# Patient Record
Sex: Female | Born: 1990 | Race: Black or African American | Hispanic: No | Marital: Single | Smoking: Never smoker
Health system: Southern US, Community
[De-identification: ages and names within clinical notes are randomized; demographics above are authoritative.]

## PROBLEM LIST (undated history)

## (undated) DIAGNOSIS — L309 Dermatitis, unspecified: Secondary | ICD-10-CM

## (undated) DIAGNOSIS — J45909 Unspecified asthma, uncomplicated: Secondary | ICD-10-CM

## (undated) DIAGNOSIS — D649 Anemia, unspecified: Secondary | ICD-10-CM

## (undated) HISTORY — PX: DENTAL SURGERY: SHX609

## (undated) HISTORY — DX: Anemia, unspecified: D64.9

---

## 2015-07-15 ENCOUNTER — Emergency Department (HOSPITAL_COMMUNITY)
Admission: EM | Admit: 2015-07-15 | Discharge: 2015-07-15 | Disposition: A | Discharge status: Home / Self Care | Payer: No Typology Code available for payment source | Attending: Emergency Medicine | Admitting: Emergency Medicine

## 2015-07-15 ENCOUNTER — Encounter (HOSPITAL_COMMUNITY): Payer: Self-pay | Admitting: *Deleted

## 2015-07-15 DIAGNOSIS — Y9389 Activity, other specified: Secondary | ICD-10-CM | POA: Diagnosis not present

## 2015-07-15 DIAGNOSIS — R51 Headache: Secondary | ICD-10-CM

## 2015-07-15 DIAGNOSIS — R519 Headache, unspecified: Secondary | ICD-10-CM

## 2015-07-15 DIAGNOSIS — Y998 Other external cause status: Secondary | ICD-10-CM | POA: Insufficient documentation

## 2015-07-15 DIAGNOSIS — Y92410 Unspecified street and highway as the place of occurrence of the external cause: Secondary | ICD-10-CM | POA: Diagnosis not present

## 2015-07-15 DIAGNOSIS — S0990XA Unspecified injury of head, initial encounter: Secondary | ICD-10-CM | POA: Insufficient documentation

## 2015-07-15 DIAGNOSIS — J45909 Unspecified asthma, uncomplicated: Secondary | ICD-10-CM | POA: Insufficient documentation

## 2015-07-15 HISTORY — DX: Unspecified asthma, uncomplicated: J45.909

## 2015-07-15 MED ORDER — ACETAMINOPHEN 325 MG PO TABS
650.0000 mg | ORAL_TABLET | Freq: Once | ORAL | Status: AC
  Administered 2015-07-15: 650 mg via ORAL
  Filled 2015-07-15: qty 2
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
## 2015-07-15 NOTE — ED Notes (Signed)
Declined W/C at D/C and was escorted to lobby by RN. 
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
## 2016-01-03 ENCOUNTER — Other Ambulatory Visit: Payer: Self-pay | Admitting: Family Medicine

## 2016-01-03 ENCOUNTER — Other Ambulatory Visit (HOSPITAL_COMMUNITY)
Admission: RE | Admit: 2016-01-03 | Discharge: 2016-01-03 | Disposition: A | Discharge status: Home / Self Care | Payer: 59 | Source: Ambulatory Visit | Attending: Family Medicine | Admitting: Family Medicine

## 2016-01-03 DIAGNOSIS — Z01419 Encounter for gynecological examination (general) (routine) without abnormal findings: Secondary | ICD-10-CM | POA: Insufficient documentation

## 2016-01-07 LAB — CYTOLOGY - PAP: Diagnosis: NEGATIVE

## 2016-08-01 DIAGNOSIS — N920 Excessive and frequent menstruation with regular cycle: Secondary | ICD-10-CM | POA: Diagnosis not present

## 2016-08-01 DIAGNOSIS — N898 Other specified noninflammatory disorders of vagina: Secondary | ICD-10-CM | POA: Diagnosis not present

## 2016-08-01 DIAGNOSIS — D509 Iron deficiency anemia, unspecified: Secondary | ICD-10-CM | POA: Diagnosis not present

## 2016-08-01 DIAGNOSIS — J4530 Mild persistent asthma, uncomplicated: Secondary | ICD-10-CM | POA: Diagnosis not present

## 2016-08-14 DIAGNOSIS — S91312A Laceration without foreign body, left foot, initial encounter: Secondary | ICD-10-CM | POA: Diagnosis not present

## 2017-05-14 DIAGNOSIS — Z01419 Encounter for gynecological examination (general) (routine) without abnormal findings: Secondary | ICD-10-CM | POA: Diagnosis not present

## 2017-05-14 DIAGNOSIS — N760 Acute vaginitis: Secondary | ICD-10-CM | POA: Diagnosis not present

## 2017-08-06 DIAGNOSIS — L739 Follicular disorder, unspecified: Secondary | ICD-10-CM | POA: Diagnosis not present

## 2017-10-15 DIAGNOSIS — N898 Other specified noninflammatory disorders of vagina: Secondary | ICD-10-CM | POA: Diagnosis not present

## 2017-10-15 DIAGNOSIS — H6120 Impacted cerumen, unspecified ear: Secondary | ICD-10-CM | POA: Diagnosis not present

## 2017-10-26 DIAGNOSIS — R42 Dizziness and giddiness: Secondary | ICD-10-CM | POA: Diagnosis not present

## 2017-12-11 DIAGNOSIS — H6120 Impacted cerumen, unspecified ear: Secondary | ICD-10-CM | POA: Diagnosis not present

## 2017-12-11 DIAGNOSIS — B353 Tinea pedis: Secondary | ICD-10-CM | POA: Diagnosis not present

## 2017-12-11 DIAGNOSIS — D509 Iron deficiency anemia, unspecified: Secondary | ICD-10-CM | POA: Diagnosis not present

## 2017-12-11 DIAGNOSIS — N946 Dysmenorrhea, unspecified: Secondary | ICD-10-CM | POA: Diagnosis not present

## 2017-12-18 ENCOUNTER — Encounter (HOSPITAL_COMMUNITY): Payer: 59

## 2017-12-24 ENCOUNTER — Other Ambulatory Visit (HOSPITAL_COMMUNITY): Payer: Self-pay | Admitting: *Deleted

## 2017-12-25 ENCOUNTER — Encounter (HOSPITAL_COMMUNITY): Payer: Self-pay

## 2017-12-25 DIAGNOSIS — D500 Iron deficiency anemia secondary to blood loss (chronic): Secondary | ICD-10-CM | POA: Diagnosis not present

## 2017-12-25 DIAGNOSIS — N920 Excessive and frequent menstruation with regular cycle: Secondary | ICD-10-CM | POA: Diagnosis not present

## 2017-12-25 DIAGNOSIS — N898 Other specified noninflammatory disorders of vagina: Secondary | ICD-10-CM | POA: Diagnosis not present

## 2017-12-25 DIAGNOSIS — N946 Dysmenorrhea, unspecified: Secondary | ICD-10-CM | POA: Diagnosis not present

## 2018-02-16 DIAGNOSIS — N760 Acute vaginitis: Secondary | ICD-10-CM | POA: Diagnosis not present

## 2018-03-09 DIAGNOSIS — H6120 Impacted cerumen, unspecified ear: Secondary | ICD-10-CM | POA: Diagnosis not present

## 2018-03-09 DIAGNOSIS — H9201 Otalgia, right ear: Secondary | ICD-10-CM | POA: Diagnosis not present

## 2019-05-02 DIAGNOSIS — Z3202 Encounter for pregnancy test, result negative: Secondary | ICD-10-CM | POA: Diagnosis not present

## 2019-05-02 DIAGNOSIS — Z3042 Encounter for surveillance of injectable contraceptive: Secondary | ICD-10-CM | POA: Diagnosis not present

## 2019-05-10 DIAGNOSIS — Z0000 Encounter for general adult medical examination without abnormal findings: Secondary | ICD-10-CM | POA: Diagnosis not present

## 2019-05-10 DIAGNOSIS — D649 Anemia, unspecified: Secondary | ICD-10-CM | POA: Diagnosis not present

## 2019-07-18 DIAGNOSIS — Z3042 Encounter for surveillance of injectable contraceptive: Secondary | ICD-10-CM | POA: Diagnosis not present

## 2019-08-22 DIAGNOSIS — N765 Ulceration of vagina: Secondary | ICD-10-CM | POA: Diagnosis not present

## 2019-08-22 DIAGNOSIS — N760 Acute vaginitis: Secondary | ICD-10-CM | POA: Diagnosis not present

## 2019-08-25 DIAGNOSIS — N898 Other specified noninflammatory disorders of vagina: Secondary | ICD-10-CM | POA: Diagnosis not present

## 2019-08-29 DIAGNOSIS — Z111 Encounter for screening for respiratory tuberculosis: Secondary | ICD-10-CM | POA: Diagnosis not present

## 2019-10-03 DIAGNOSIS — Z03818 Encounter for observation for suspected exposure to other biological agents ruled out: Secondary | ICD-10-CM | POA: Diagnosis not present

## 2019-10-03 DIAGNOSIS — Z20822 Contact with and (suspected) exposure to covid-19: Secondary | ICD-10-CM | POA: Diagnosis not present

## 2019-10-03 DIAGNOSIS — Z3042 Encounter for surveillance of injectable contraceptive: Secondary | ICD-10-CM | POA: Diagnosis not present

## 2019-10-04 DIAGNOSIS — Z20822 Contact with and (suspected) exposure to covid-19: Secondary | ICD-10-CM | POA: Diagnosis not present

## 2019-10-04 DIAGNOSIS — Z03818 Encounter for observation for suspected exposure to other biological agents ruled out: Secondary | ICD-10-CM | POA: Diagnosis not present

## 2019-10-13 DIAGNOSIS — Z20822 Contact with and (suspected) exposure to covid-19: Secondary | ICD-10-CM | POA: Diagnosis not present

## 2019-11-06 DIAGNOSIS — H6121 Impacted cerumen, right ear: Secondary | ICD-10-CM | POA: Diagnosis not present

## 2019-12-19 DIAGNOSIS — Z3042 Encounter for surveillance of injectable contraceptive: Secondary | ICD-10-CM | POA: Diagnosis not present

## 2020-01-10 ENCOUNTER — Other Ambulatory Visit (HOSPITAL_COMMUNITY): Payer: Self-pay | Admitting: Family Medicine

## 2020-02-23 ENCOUNTER — Other Ambulatory Visit (HOSPITAL_COMMUNITY): Payer: Self-pay | Admitting: Family Medicine

## 2020-07-08 ENCOUNTER — Encounter: Payer: Self-pay | Admitting: Internal Medicine

## 2020-08-01 ENCOUNTER — Encounter: Payer: Self-pay | Admitting: Nurse Practitioner

## 2020-08-01 ENCOUNTER — Other Ambulatory Visit (HOSPITAL_COMMUNITY)
Admission: RE | Admit: 2020-08-01 | Discharge: 2020-08-01 | Disposition: A | Discharge status: Home / Self Care | Payer: BC Managed Care – PPO | Source: Ambulatory Visit | Attending: Nurse Practitioner | Admitting: Nurse Practitioner

## 2020-08-01 ENCOUNTER — Other Ambulatory Visit: Payer: Self-pay

## 2020-08-01 ENCOUNTER — Ambulatory Visit (INDEPENDENT_AMBULATORY_CARE_PROVIDER_SITE_OTHER): Payer: BC Managed Care – PPO | Admitting: Nurse Practitioner

## 2020-08-01 VITALS — BP 104/68 | HR 98 | Resp 16 | Ht 65.25 in | Wt 141.0 lb

## 2020-08-01 DIAGNOSIS — Z01419 Encounter for gynecological examination (general) (routine) without abnormal findings: Secondary | ICD-10-CM | POA: Diagnosis not present

## 2020-08-01 DIAGNOSIS — D649 Anemia, unspecified: Secondary | ICD-10-CM

## 2020-08-01 DIAGNOSIS — Z113 Encounter for screening for infections with a predominantly sexual mode of transmission: Secondary | ICD-10-CM | POA: Diagnosis not present

## 2020-08-01 LAB — CBC WITH DIFFERENTIAL/PLATELET
Basophils Absolute: 52 cells/uL (ref 0–200)
Eosinophils Relative: 6.7 %
Neutrophils Relative %: 52.5 %
RBC: 3.71 10*6/uL — ABNORMAL LOW (ref 3.80–5.10)
WBC: 5.8 10*3/uL (ref 3.8–10.8)

## 2020-08-01 NOTE — Progress Notes (Signed)
   Amanda Ortega 1990-09-03 [id number]   History:  30 y.o. G0 presents as new patient to establish care. Was on Depo Provera for menorrhagia but it caused mood changes and significant hunger so she stopped with last dose in September 2021. She has had 1 cycle since then. Normal pap history. History of anemia secondary to heavy menses, taking OTC iron supplement. Sexually active with one partner. Is not interested in contraception at this time.   Gynecologic History Patient's last menstrual period was 07/04/2020 (exact date).   Contraception/Family planning: none  Health Maintenance Last Pap: 12/2015. Results were: normal Last mammogram: Not indicated Last colonoscopy: Not indicated Last Dexa: Not indicated  Past medical history, past surgical history, family history and social history were all reviewed and documented in the EPIC chart. 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   ROS:  A ROS was performed and pertinent positives and negatives are included.  Exam:  Vitals:   08/01/20 0857  BP: 104/68  Pulse: 98  Resp: 16  Weight: 141 lb (64 kg)  Height: 5' 5.25" (1.657 m)   Body mass index is 23.28 kg/m.  General appearance:  Normal Thyroid:  Symmetrical, normal in size, without palpable masses or nodularity. Respiratory  Auscultation:  Clear without wheezing or rhonchi Cardiovascular  Auscultation:  Regular rate, without rubs, murmurs or gallops  Edema/varicosities:  Not grossly evident Abdominal  Soft,nontender, without masses, guarding or rebound.  Liver/spleen:  No organomegaly noted  Hernia:  None appreciated  Skin  Inspection:  Grossly normal Breasts: Examined lying and sitting.   Right: Without masses, retractions, nipple discharge or axillary adenopathy.   Left: Without masses, retractions, nipple discharge or axillary adenopathy. Genitourinary   Inguinal/mons:  Normal without inguinal adenopathy  External genitalia:  Normal appearing vulva with no  masses, tenderness, or lesions  BUS/Urethra/Skene's glands:  Normal  Vagina:  Normal appearing with normal color and discharge, no lesions  Cervix:  Normal appearing without discharge or lesions  Uterus:  Normal in size, shape and contour.  Midline and mobile, nontender  Adnexa/parametria:     Rt: Normal in size, without masses or tenderness.   Lt: Normal in size, without masses or tenderness.  Anus and perineum: Normal  Assessment/Plan:  30 y.o. G0 for annual exam.   Well female exam with routine gynecological exam - Plan: Cytology - PAP( [facility]). Education provided on SBEs, importance of preventative screenings, current guidelines, high calcium diet, regular exercise, and multivitamin daily.   Anemia, unspecified type - Plan: CBC with Differential/Platelet. History of anemia secondary to heavy menses. Was on Depo Provera for about 2 years with good control of bleeding but she decided to stop (last dose 11/2019) due to mood changes and intense hunger. She has had 1 cycle since. She is not interested in any form of hormonal contraception at this time. If cycles become heavy she will reconsider.   Screen for STD (sexually transmitted disease) - Plan: RPR, HIV Antibody (routine testing w rflx), Cytology - PAP( [facility])  Screening for cervical cancer - normal pap history. Pap today.  Return in 1 year for annual.    Olivia Mackie DNP, 9:29 AM 08/01/2020

## 2020-08-01 NOTE — Patient Instructions (Signed)
Health Maintenance, Female Adopting a healthy lifestyle and getting preventive care are important in promoting health and wellness. Ask your health care provider about:  The right schedule for you to have regular tests and exams.  Things you can do on your own to prevent diseases and keep yourself healthy. What should I know about diet, weight, and exercise? Eat a healthy diet  Eat a diet that includes plenty of vegetables, fruits, low-fat dairy products, and lean protein.  Do not eat a lot of foods that are high in solid fats, added sugars, or sodium.   Maintain a healthy weight Body mass index (BMI) is used to identify weight problems. It estimates body fat based on height and weight. Your health care provider can help determine your BMI and help you achieve or maintain a healthy weight. Get regular exercise Get regular exercise. This is one of the most important things you can do for your health. Most adults should:  Exercise for at least 150 minutes each week. The exercise should increase your heart rate and make you sweat (moderate-intensity exercise).  Do strengthening exercises at least twice a week. This is in addition to the moderate-intensity exercise.  Spend less time sitting. Even light physical activity can be beneficial. Watch cholesterol and blood lipids Have your blood tested for lipids and cholesterol at 28 years of age, then have this test every 5 years. Have your cholesterol levels checked more often if:  Your lipid or cholesterol levels are high.  You are older than 28 years of age.  You are at high risk for heart disease. What should I know about cancer screening? Depending on your health history and family history, you may need to have cancer screening at various ages. This may include screening for:  Breast cancer.  Cervical cancer.  Colorectal cancer.  Skin cancer.  Lung cancer. What should I know about heart disease, diabetes, and high blood  pressure? Blood pressure and heart disease  High blood pressure causes heart disease and increases the risk of stroke. This is more likely to develop in people who have high blood pressure readings, are of African descent, or are overweight.  Have your blood pressure checked: ? Every 3-5 years if you are 18-39 years of age. ? Every year if you are 40 years old or older. Diabetes Have regular diabetes screenings. This checks your fasting blood sugar level. Have the screening done:  Once every three years after age 40 if you are at a normal weight and have a low risk for diabetes.  More often and at a younger age if you are overweight or have a high risk for diabetes. What should I know about preventing infection? Hepatitis B If you have a higher risk for hepatitis B, you should be screened for this virus. Talk with your health care provider to find out if you are at risk for hepatitis B infection. Hepatitis C Testing is recommended for:  Everyone born from 1945 through 1965.  Anyone with known risk factors for hepatitis C. Sexually transmitted infections (STIs)  Get screened for STIs, including gonorrhea and chlamydia, if: ? You are sexually active and are younger than 28 years of age. ? You are older than 28 years of age and your health care provider tells you that you are at risk for this type of infection. ? Your sexual activity has changed since you were last screened, and you are at increased risk for chlamydia or gonorrhea. Ask your health care provider   if you are at risk.  Ask your health care provider about whether you are at high risk for HIV. Your health care provider may recommend a prescription medicine to help prevent HIV infection. If you choose to take medicine to prevent HIV, you should first get tested for HIV. You should then be tested every 3 months for as long as you are taking the medicine. Pregnancy  If you are about to stop having your period (premenopausal) and  you may become pregnant, seek counseling before you get pregnant.  Take 400 to 800 micrograms (mcg) of folic acid every day if you become pregnant.  Ask for birth control (contraception) if you want to prevent pregnancy. Osteoporosis and menopause Osteoporosis is a disease in which the bones lose minerals and strength with aging. This can result in bone fractures. If you are 65 years old or older, or if you are at risk for osteoporosis and fractures, ask your health care provider if you should:  Be screened for bone loss.  Take a calcium or vitamin D supplement to lower your risk of fractures.  Be given hormone replacement therapy (HRT) to treat symptoms of menopause. Follow these instructions at home: Lifestyle  Do not use any products that contain nicotine or tobacco, such as cigarettes, e-cigarettes, and chewing tobacco. If you need help quitting, ask your health care provider.  Do not use street drugs.  Do not share needles.  Ask your health care provider for help if you need support or information about quitting drugs. Alcohol use  Do not drink alcohol if: ? Your health care provider tells you not to drink. ? You are pregnant, may be pregnant, or are planning to become pregnant.  If you drink alcohol: ? Limit how much you use to 0-1 drink a day. ? Limit intake if you are breastfeeding.  Be aware of how much alcohol is in your drink. In the [place] one drink equals one 12 oz bottle of beer (355 mL), one 5 oz glass of wine (148 mL), or one 1 oz glass of hard liquor (44 mL). General instructions  Schedule regular health, dental, and eye exams.  Stay current with your vaccines.  Tell your health care provider if: ? You often feel depressed. ? You have ever been abused or do not feel safe at home. Summary  Adopting a healthy lifestyle and getting preventive care are important in promoting health and wellness.  Follow your health care provider's instructions about healthy  diet, exercising, and getting tested or screened for diseases.  Follow your health care provider's instructions on monitoring your cholesterol and blood pressure. This information is not intended to replace advice given to you by your health care provider. Make sure you discuss any questions you have with your health care provider. Document Revised: 03/03/2018 Document Reviewed: 03/03/2018 Elsevier Patient Education  2021 Elsevier Inc.  

## 2020-08-02 ENCOUNTER — Encounter (HOSPITAL_COMMUNITY): Payer: Self-pay

## 2020-08-02 ENCOUNTER — Ambulatory Visit (INDEPENDENT_AMBULATORY_CARE_PROVIDER_SITE_OTHER): Payer: BC Managed Care – PPO

## 2020-08-02 ENCOUNTER — Ambulatory Visit (HOSPITAL_COMMUNITY)
Admission: EM | Admit: 2020-08-02 | Discharge: 2020-08-02 | Disposition: A | Discharge status: Home / Self Care | Payer: BC Managed Care – PPO | Attending: Physician Assistant | Admitting: Physician Assistant

## 2020-08-02 DIAGNOSIS — W57XXXA Bitten or stung by nonvenomous insect and other nonvenomous arthropods, initial encounter: Secondary | ICD-10-CM

## 2020-08-02 DIAGNOSIS — S60862A Insect bite (nonvenomous) of left wrist, initial encounter: Secondary | ICD-10-CM | POA: Diagnosis not present

## 2020-08-02 DIAGNOSIS — S60459A Superficial foreign body of unspecified finger, initial encounter: Secondary | ICD-10-CM | POA: Diagnosis not present

## 2020-08-02 DIAGNOSIS — S80862A Insect bite (nonvenomous), left lower leg, initial encounter: Secondary | ICD-10-CM

## 2020-08-02 DIAGNOSIS — M79644 Pain in right finger(s): Secondary | ICD-10-CM | POA: Diagnosis not present

## 2020-08-02 LAB — CYTOLOGY - PAP
Chlamydia: NEGATIVE
Comment: NEGATIVE
Comment: NORMAL
Diagnosis: NEGATIVE
Neisseria Gonorrhea: NEGATIVE

## 2020-08-02 LAB — CBC WITH DIFFERENTIAL/PLATELET
Absolute Monocytes: 760 cells/uL (ref 200–950)
Basophils Relative: 0.9 %
Eosinophils Absolute: 389 cells/uL (ref 15–500)
HCT: 36.2 % (ref 35.0–45.0)
Hemoglobin: 11.7 g/dL (ref 11.7–15.5)
Lymphs Abs: 1554 cells/uL (ref 850–3900)
MCH: 31.5 pg (ref 27.0–33.0)
MCHC: 32.3 g/dL (ref 32.0–36.0)
MCV: 97.6 fL (ref 80.0–100.0)
MPV: 10.7 fL (ref 7.5–12.5)
Monocytes Relative: 13.1 %
Neutro Abs: 3045 cells/uL (ref 1500–7800)
Platelets: 211 10*3/uL (ref 140–400)
RDW: 11.7 % (ref 11.0–15.0)
Total Lymphocyte: 26.8 %

## 2020-08-02 LAB — RPR: RPR Ser Ql: NONREACTIVE

## 2020-08-02 LAB — HIV ANTIBODY (ROUTINE TESTING W REFLEX): HIV 1&2 Ab, 4th Generation: NONREACTIVE

## 2020-08-02 NOTE — ED Triage Notes (Signed)
Pt reports pain swelling in the right thumb x 3 weeks. States she has a pice of glass.   Pt reports burning and itchy sensation in the lower legs x 1 week. Pt states it may be related to insect bites.

## 2020-08-02 NOTE — Discharge Instructions (Signed)
Hydrocortisone cream to rash.  Topical antibiotic to finger

## 2020-08-03 NOTE — ED Provider Notes (Signed)
[facility]    CSN: [id number] Arrival date & time: 08/02/20  1930      History   Chief Complaint Chief Complaint  Patient presents with  . Hand Pain  . Foreign Body  . Rash    HPI Amanda Ortega is a 30 y.o. female.   Pt reports she thinks she has a piece of glass from a broken mirror in her thumb.  Pt also has insect bites on her legs.   The history is provided by the patient. No language interpreter was used.  Hand Pain This is a new problem. The problem occurs constantly. Nothing aggravates the symptoms. Nothing relieves the symptoms. She has tried nothing for the symptoms.  Foreign Body Rash   Past Medical History:  Diagnosis Date  . Anemia   . Asthma     There are no problems to display for this patient.   Past Surgical History:  Procedure Laterality Date  . DENTAL SURGERY      OB History    Gravida  0   Para  0   Term  0   Preterm  0   AB  0   Living  0     SAB  0   IAB  0   Ectopic  0   Multiple  0   Live Births  0            Home Medications    Prior to Admission medications   Medication Sig Start Date End Date Taking? Authorizing Provider  albuterol (VENTOLIN HFA) 108 (90 Base) MCG/ACT inhaler SMARTSIG:2 Puff(s) By Mouth Every 6 Hours 04/02/20   [provider]  cetirizine (ZYRTEC) 10 MG tablet Take 10 mg by mouth daily.    [provider]  Ferrous Sulfate (IRON PO) Take by mouth.    [provider]  fluticasone (FLONASE) 50 MCG/ACT nasal spray Place 2 sprays into both nostrils daily. 07/01/20   [provider]  triamcinolone (KENALOG) 0.1 % APPLY EXTERNALLY TWO TIMES A DAY 02/23/20 02/22/21  Blair Heys, MD    Family History Family History  Problem Relation Age of Onset  . Hypertension Mother   . Hypertension Father     Social History Social History   Tobacco Use  . Smoking status: Never Smoker  . Smokeless tobacco: Never Used  Substance Use Topics  . Alcohol use:  Yes    Comment: social  . Drug use: Never     Allergies   Shellfish allergy and Naproxen   Review of Systems Review of Systems  Skin: Positive for rash.  All other systems reviewed and are negative.    Physical Exam Triage Vital Signs ED Triage Vitals  Enc Vitals Group     BP 08/02/20 1954 (!) 104/58     Pulse Rate 08/02/20 1954 79     Resp 08/02/20 1954 15     Temp 08/02/20 1954 98.5 F (36.9 C)     Temp Source 08/02/20 1954 Oral     SpO2 08/02/20 1954 100 %     Weight --      Height --      Head Circumference --      Peak Flow --      Pain Score 08/02/20 1953 3     Pain Loc --      Pain Edu? --      Excl. in GC? --    No data found.  Updated Vital Signs BP (!) 104/58 (BP  Location: Right Arm)   Pulse 79   Temp 98.5 F (36.9 C) (Oral)   Resp 15   LMP 07/04/2020 (Exact Date)   SpO2 100%   Visual Acuity Right Eye Distance:   Left Eye Distance:   Bilateral Distance:    Right Eye Near:   Left Eye Near:    Bilateral Near:     Physical Exam Vitals and nursing note reviewed.  Musculoskeletal:        General: Swelling present.     Comments: Swollen area right thumb,  Scattered bites bilat lower legs   Skin:    General: Skin is warm.  Neurological:     General: No focal deficit present.     Mental Status: She is alert.  Psychiatric:        Mood and Affect: Mood normal.      UC Treatments / Results  Labs (all labs ordered are listed, but only abnormal results are displayed) Labs Reviewed - No data to display  EKG   Radiology DG Finger Thumb Right  Result Date: 08/02/2020 CLINICAL DATA:  Thumb pain for several weeks, initial encounter EXAM: RIGHT THUMB 2+V COMPARISON:  None. FINDINGS: No acute fracture or dislocation is noted. No radiopaque foreign body is seen. No soft tissue abnormality is noted. IMPRESSION: No acute abnormality seen. Electronically Signed   By: Alcide Clever M.D.   On: 08/02/2020 20:36    Procedures Procedures (including  critical care time)  Medications Ordered in UC Medications - No data to display  Initial Impression / Assessment and Plan / UC Course  I have reviewed the triage vital signs and the nursing notes.  Pertinent labs & imaging results that were available during my care of the patient were reviewed by me and considered in my medical decision making (see chart for details).     MDM:  Pt advised hydrocortisone ointment for rash on legs.   Procedure.  I removed scab on thumb,  I did not see obvious foreign body.  I may have removed with scab.  I counseled pt on follow up Final Clinical Impressions(s) / UC Diagnoses   Final diagnoses:  Foreign body of finger  Insect bite of left lower leg, initial encounter     Discharge Instructions     Hydrocortisone cream to rash.  Topical antibiotic to finger    ED Prescriptions    None     PDMP not reviewed this encounter.  An After Visit Summary was printed and given to the patient.    Elson Areas, [place] 08/03/20 1224

## 2020-08-23 ENCOUNTER — Other Ambulatory Visit: Payer: Self-pay

## 2020-08-24 ENCOUNTER — Encounter: Payer: Self-pay | Admitting: Internal Medicine

## 2020-08-24 ENCOUNTER — Ambulatory Visit: Payer: BC Managed Care – PPO | Admitting: Internal Medicine

## 2020-08-24 VITALS — BP 98/64 | HR 84 | Temp 97.7°F | Ht 64.5 in | Wt 141.0 lb

## 2020-08-24 DIAGNOSIS — H6121 Impacted cerumen, right ear: Secondary | ICD-10-CM | POA: Diagnosis not present

## 2020-08-24 DIAGNOSIS — Z0000 Encounter for general adult medical examination without abnormal findings: Secondary | ICD-10-CM | POA: Diagnosis not present

## 2020-08-24 DIAGNOSIS — L309 Dermatitis, unspecified: Secondary | ICD-10-CM | POA: Diagnosis not present

## 2020-08-24 MED ORDER — TRIAMCINOLONE ACETONIDE 0.1 % EX CREA: TOPICAL_CREAM | CUTANEOUS | 1 refills | Status: AC

## 2020-08-24 NOTE — Progress Notes (Signed)
New Patient Office Visit     This visit occurred during the SARS-CoV-2 public health emergency.  Safety protocols were in place, including screening questions prior to the visit, additional usage of staff PPE, and extensive cleaning of exam room while observing appropriate contact time as indicated for disinfecting solutions.    CC/Reason for Visit: Establish care, annual preventive exam Previous PCP: None Last Visit: Unknown  HPI: Amanda Ortega is a 30 y.o. female who is coming in today for the above mentioned reasons. Past Medical History is significant for: Iron deficiency anemia, I do not have any current iron levels.  She recently visited her GYN who did a Pap smear in May as well as STD screening.  She has routine dental care.  She has not had an eye exam in many years.  She has had 2 COVID vaccines, has not yet had a booster.  She works as an Nurse, learning disability.  She has had no past surgical history, no known drug allergies, her family history significant for mother with uterine fibroids and significant iron deficiency.   Past Medical/Surgical History: Past Medical History:  Diagnosis Date  . Anemia   . Asthma     Past Surgical History:  Procedure Laterality Date  . DENTAL SURGERY      Social History:  reports that she has never smoked. She has never used smokeless tobacco. She reports current alcohol use. She reports that she does not use drugs.  Allergies: Allergies  Allergen Reactions  . Shellfish Allergy Swelling    Lip swelling   . Naproxen Hives and Swelling    Tongue swelling    Family History:  Family History  Problem Relation Age of Onset  . Hypertension Mother   . Hypertension Father      Current Outpatient Medications:  .  albuterol (VENTOLIN HFA) 108 (90 Base) MCG/ACT inhaler, SMARTSIG:2 Puff(s) By Mouth Every 6 Hours, Disp: , Rfl:  .  cetirizine (ZYRTEC) 10 MG tablet, Take 10 mg by mouth daily., Disp: , Rfl:  .  Ferrous Sulfate (IRON PO),  Take by mouth., Disp: , Rfl:  .  fluticasone (FLONASE) 50 MCG/ACT nasal spray, Place 2 sprays into both nostrils daily., Disp: , Rfl:  .  triamcinolone cream (KENALOG) 0.1 %, APPLY EXTERNALLY TWO TIMES A DAY, Disp: 30 g, Rfl: 1  Review of Systems:  Constitutional: Denies fever, chills, diaphoresis, appetite change and fatigue.  HEENT: Denies photophobia, eye pain, redness, hearing loss, ear pain, congestion, sore throat, rhinorrhea, sneezing, mouth sores, trouble swallowing, neck pain, neck stiffness and tinnitus.   Respiratory: Denies SOB, DOE, cough, chest tightness,  and wheezing.   Cardiovascular: Denies chest pain, palpitations and leg swelling.  Gastrointestinal: Denies nausea, vomiting, abdominal pain, diarrhea, constipation, blood in stool and abdominal distention.  Genitourinary: Denies dysuria, urgency, frequency, hematuria, flank pain and difficulty urinating.  Endocrine: Denies: hot or cold intolerance, sweats, changes in hair or nails, polyuria, polydipsia. Musculoskeletal: Denies myalgias, back pain, joint swelling, arthralgias and gait problem.  Skin: Denies pallor, rash and wound.  Neurological: Denies dizziness, seizures, syncope, weakness, light-headedness, numbness and headaches.  Hematological: Denies adenopathy. Easy bruising, personal or family bleeding history  Psychiatric/Behavioral: Denies suicidal ideation, mood changes, confusion, nervousness, sleep disturbance and agitation    Physical Exam: Vitals:   08/24/20 0710  BP: 98/64  Pulse: 84  Temp: 97.7 F (36.5 C)  TempSrc: Oral  SpO2: 99%  Weight: 141 lb (64 kg)  Height: 5' 4.5" (1.638 m)  Body mass index is 23.83 kg/m.  Constitutional: NAD, calm, comfortable Eyes: PERRL, lids and conjunctivae normal ENMT: Mucous membranes are moist. Posterior pharynx clear of any exudate or lesions. Normal dentition. Tympanic membrane is pearly white, no erythema or bulging on the left, right is obstructed by  cerumen. Neck: normal, supple, no masses, no thyromegaly Respiratory: clear to auscultation bilaterally, no wheezing, no crackles. Normal respiratory effort. No accessory muscle use.  Cardiovascular: Regular rate and rhythm, no murmurs / rubs / gallops. No extremity edema. 2+ pedal pulses. No carotid bruits.  Abdomen: no tenderness, no masses palpated. No hepatosplenomegaly. Bowel sounds positive.  Musculoskeletal: no clubbing / cyanosis. No joint deformity upper and lower extremities. Good ROM, no contractures. Normal muscle tone.  Skin: no rashes, lesions, ulcers. No induration Neurologic: CN 2-12 grossly intact. Sensation intact, DTR normal. Strength 5/5 in all 4.  Psychiatric: Normal judgment and insight. Alert and oriented x 3. Normal mood.    Impression and Plan:  Encounter for preventive health examination -Advised routine eye and dental care. -All immunizations are up-to-date with the exception of her COVID booster.  She has completed her HPV series as well. -Screening labs today. -Healthy lifestyle discussed. -Commence routine breast cancer screening age 36 and colon cancer screening age 3. -She had a Pap smear in May 2022 that was negative.  Right ear impacted cerumen  -Cerumen Desimpaction  After patient consent was obtained, warm water was applied and gentle ear lavage performed on right ear. There were no complications and following the desimpaction the tympanic membranes were visible. Tympanic membranes are intact following the procedure. Auditory canals are normal. The patient reported relief of symptoms after removal of cerumen.   Eczema, unspecified type   Plan: triamcinolone cream (KENALOG) 0.1 %    Patient Instructions   -Nice seeing you today!!  -Lab work today; will notify you once results are available.  -Remember your COVID booster.   Preventive Care 42-31 Years Old, Female Preventive care refers to lifestyle choices and visits with your health care  provider that can promote health and wellness. This includes:  A yearly physical exam. This is also called an annual wellness visit.  Regular dental and eye exams.  Immunizations.  Screening for certain conditions.  Healthy lifestyle choices, such as: ? Eating a healthy diet. ? Getting regular exercise. ? Not using drugs or products that contain nicotine and tobacco. ? Limiting alcohol use. What can I expect for my preventive care visit? Physical exam Your health care provider may check your:  Height and weight. These may be used to calculate your BMI (body mass index). BMI is a measurement that tells if you are at a healthy weight.  Heart rate and blood pressure.  Body temperature.  Skin for abnormal spots. Counseling Your health care provider may ask you questions about your:  Past medical problems.  Family's medical history.  Alcohol, tobacco, and drug use.  Emotional well-being.  Home life and relationship well-being.  Sexual activity.  Diet, exercise, and sleep habits.  Work and work Statistician.  Access to firearms.  Method of birth control.  Menstrual cycle.  Pregnancy history. What immunizations do I need? Vaccines are usually given at various ages, according to a schedule. Your health care provider will recommend vaccines for you based on your age, medical history, and lifestyle or other factors, such as travel or where you work.   What tests do I need? Blood tests  Lipid and cholesterol levels. These may be checked every  5 years starting at age 28.  Hepatitis C test.  Hepatitis B test. Screening  Diabetes screening. This is done by checking your blood sugar (glucose) after you have not eaten for a while (fasting).  STD (sexually transmitted disease) testing, if you are at risk.  BRCA-related cancer screening. This may be done if you have a family history of breast, ovarian, tubal, or peritoneal cancers.  Pelvic exam and Pap test. This  may be done every 3 years starting at age 45. Starting at age 62, this may be done every 5 years if you have a Pap test in combination with an HPV test. Talk with your health care provider about your test results, treatment options, and if necessary, the need for more tests.   Follow these instructions at home: Eating and drinking  Eat a healthy diet that includes fresh fruits and vegetables, whole grains, lean protein, and low-fat dairy products.  Take vitamin and mineral supplements as recommended by your health care provider.  Do not drink alcohol if: ? Your health care provider tells you not to drink. ? You are pregnant, may be pregnant, or are planning to become pregnant.  If you drink alcohol: ? Limit how much you have to 0-1 drink a day. ? Be aware of how much alcohol is in your drink. In the [place] one drink equals one 12 oz bottle of beer (355 mL), one 5 oz glass of wine (148 mL), or one 1 oz glass of hard liquor (44 mL).   Lifestyle  Take daily care of your teeth and gums. Brush your teeth every morning and night with fluoride toothpaste. Floss one time each day.  Stay active. Exercise for at least 30 minutes 5 or more days each week.  Do not use any products that contain nicotine or tobacco, such as cigarettes, e-cigarettes, and chewing tobacco. If you need help quitting, ask your health care provider.  Do not use drugs.  If you are sexually active, practice safe sex. Use a condom or other form of protection to prevent STIs (sexually transmitted infections).  If you do not wish to become pregnant, use a form of birth control. If you plan to become pregnant, see your health care provider for a prepregnancy visit.  Find healthy ways to cope with stress, such as: ? Meditation, yoga, or listening to music. ? Journaling. ? Talking to a trusted person. ? Spending time with friends and family. Safety  Always wear your seat belt while driving or riding in a vehicle.  Do not  drive: ? If you have been drinking alcohol. Do not ride with someone who has been drinking. ? When you are tired or distracted. ? While texting.  Wear a helmet and other protective equipment during sports activities.  If you have firearms in your house, make sure you follow all gun safety procedures.  Seek help if you have been physically or sexually abused. What's next?  Go to your health care provider once a year for an annual wellness visit.  Ask your health care provider how often you should have your eyes and teeth checked.  Stay up to date on all vaccines. This information is not intended to replace advice given to you by your health care provider. Make sure you discuss any questions you have with your health care provider. Document Revised: 11/06/2019 Document Reviewed: 11/19/2017 Elsevier Patient Education  2021 [place], MD [facility]

## 2020-08-24 NOTE — Patient Instructions (Signed)
-Nice seeing you today!!  -Lab work today; will notify you once results are available.  -Remember your COVID booster.   Preventive Care 32-28 Years Old, Female Preventive care refers to lifestyle choices and visits with your health care provider that can promote health and wellness. This includes:  A yearly physical exam. This is also called an annual wellness visit.  Regular dental and eye exams.  Immunizations.  Screening for certain conditions.  Healthy lifestyle choices, such as: ? Eating a healthy diet. ? Getting regular exercise. ? Not using drugs or products that contain nicotine and tobacco. ? Limiting alcohol use. What can I expect for my preventive care visit? Physical exam Your health care provider may check your:  Height and weight. These may be used to calculate your BMI (body mass index). BMI is a measurement that tells if you are at a healthy weight.  Heart rate and blood pressure.  Body temperature.  Skin for abnormal spots. Counseling Your health care provider may ask you questions about your:  Past medical problems.  Family's medical history.  Alcohol, tobacco, and drug use.  Emotional well-being.  Home life and relationship well-being.  Sexual activity.  Diet, exercise, and sleep habits.  Work and work Statistician.  Access to firearms.  Method of birth control.  Menstrual cycle.  Pregnancy history. What immunizations do I need? Vaccines are usually given at various ages, according to a schedule. Your health care provider will recommend vaccines for you based on your age, medical history, and lifestyle or other factors, such as travel or where you work.   What tests do I need? Blood tests  Lipid and cholesterol levels. These may be checked every 5 years starting at age 51.  Hepatitis C test.  Hepatitis B test. Screening  Diabetes screening. This is done by checking your blood sugar (glucose) after you have not eaten for a while  (fasting).  STD (sexually transmitted disease) testing, if you are at risk.  BRCA-related cancer screening. This may be done if you have a family history of breast, ovarian, tubal, or peritoneal cancers.  Pelvic exam and Pap test. This may be done every 3 years starting at age 1. Starting at age 62, this may be done every 5 years if you have a Pap test in combination with an HPV test. Talk with your health care provider about your test results, treatment options, and if necessary, the need for more tests.   Follow these instructions at home: Eating and drinking  Eat a healthy diet that includes fresh fruits and vegetables, whole grains, lean protein, and low-fat dairy products.  Take vitamin and mineral supplements as recommended by your health care provider.  Do not drink alcohol if: ? Your health care provider tells you not to drink. ? You are pregnant, may be pregnant, or are planning to become pregnant.  If you drink alcohol: ? Limit how much you have to 0-1 drink a day. ? Be aware of how much alcohol is in your drink. In the [place] one drink equals one 12 oz bottle of beer (355 mL), one 5 oz glass of wine (148 mL), or one 1 oz glass of hard liquor (44 mL).   Lifestyle  Take daily care of your teeth and gums. Brush your teeth every morning and night with fluoride toothpaste. Floss one time each day.  Stay active. Exercise for at least 30 minutes 5 or more days each week.  Do not use any products that contain nicotine  or tobacco, such as cigarettes, e-cigarettes, and chewing tobacco. If you need help quitting, ask your health care provider.  Do not use drugs.  If you are sexually active, practice safe sex. Use a condom or other form of protection to prevent STIs (sexually transmitted infections).  If you do not wish to become pregnant, use a form of birth control. If you plan to become pregnant, see your health care provider for a prepregnancy visit.  Find healthy ways to cope  with stress, such as: ? Meditation, yoga, or listening to music. ? Journaling. ? Talking to a trusted person. ? Spending time with friends and family. Safety  Always wear your seat belt while driving or riding in a vehicle.  Do not drive: ? If you have been drinking alcohol. Do not ride with someone who has been drinking. ? When you are tired or distracted. ? While texting.  Wear a helmet and other protective equipment during sports activities.  If you have firearms in your house, make sure you follow all gun safety procedures.  Seek help if you have been physically or sexually abused. What's next?  Go to your health care provider once a year for an annual wellness visit.  Ask your health care provider how often you should have your eyes and teeth checked.  Stay up to date on all vaccines. This information is not intended to replace advice given to you by your health care provider. Make sure you discuss any questions you have with your health care provider. Document Revised: 11/06/2019 Document Reviewed: 11/19/2017 Elsevier Patient Education  2021 [employer].

## 2020-08-27 ENCOUNTER — Other Ambulatory Visit: Payer: BC Managed Care – PPO

## 2020-08-28 ENCOUNTER — Other Ambulatory Visit: Payer: Self-pay

## 2020-08-28 ENCOUNTER — Other Ambulatory Visit (INDEPENDENT_AMBULATORY_CARE_PROVIDER_SITE_OTHER): Payer: BC Managed Care – PPO

## 2020-08-28 DIAGNOSIS — Z0000 Encounter for general adult medical examination without abnormal findings: Secondary | ICD-10-CM

## 2020-08-28 LAB — CBC WITH DIFFERENTIAL/PLATELET
Basophils Absolute: 0 10*3/uL (ref 0.0–0.1)
Basophils Relative: 0.6 % (ref 0.0–3.0)
Eosinophils Absolute: 0.3 10*3/uL (ref 0.0–0.7)
Eosinophils Relative: 5.5 % — ABNORMAL HIGH (ref 0.0–5.0)
HCT: 36 % (ref 36.0–46.0)
Hemoglobin: 12 g/dL (ref 12.0–15.0)
Lymphocytes Relative: 33.3 % (ref 12.0–46.0)
Lymphs Abs: 1.6 10*3/uL (ref 0.7–4.0)
MCHC: 33.4 g/dL (ref 30.0–36.0)
MCV: 93.9 fl (ref 78.0–100.0)
Monocytes Absolute: 0.4 10*3/uL (ref 0.1–1.0)
Monocytes Relative: 9.2 % (ref 3.0–12.0)
Neutro Abs: 2.4 10*3/uL (ref 1.4–7.7)
Neutrophils Relative %: 51.4 % (ref 43.0–77.0)
Platelets: 218 10*3/uL (ref 150.0–400.0)
RBC: 3.84 Mil/uL — ABNORMAL LOW (ref 3.87–5.11)
RDW: 12.5 % (ref 11.5–15.5)
WBC: 4.7 10*3/uL (ref 4.0–10.5)

## 2020-08-28 LAB — TSH: TSH: 1.02 u[IU]/mL (ref 0.35–4.50)

## 2020-08-29 LAB — IRON,TIBC AND FERRITIN PANEL
%SAT: 13 % — ABNORMAL LOW (ref 16–45)
Ferritin: 6 ng/mL — ABNORMAL LOW (ref 16–154)
Iron: 46 ug/dL (ref 40–190)
TIBC: 345 ug/dL (ref 250–450)

## 2020-09-26 ENCOUNTER — Telehealth (INDEPENDENT_AMBULATORY_CARE_PROVIDER_SITE_OTHER): Payer: BC Managed Care – PPO | Admitting: Family Medicine

## 2020-09-26 DIAGNOSIS — R059 Cough, unspecified: Secondary | ICD-10-CM

## 2020-09-26 DIAGNOSIS — R062 Wheezing: Secondary | ICD-10-CM

## 2020-09-26 MED ORDER — PREDNISONE 20 MG PO TABS: ORAL_TABLET | ORAL | 0 refills | Status: DC

## 2020-09-26 NOTE — Progress Notes (Signed)
Patient ID: Allyne Hebert, female   DOB: 16-Jan-1991, 30 y.o.   MRN: [id number]   This visit type was conducted due to national recommendations for restrictions regarding the COVID-19 pandemic in an effort to limit this patient's exposure and mitigate transmission in our community.   Virtual Visit via Video Note  I connected with Sheena Spake on 09/26/20 at  8:30 AM EDT by a video enabled telemedicine application and verified that I am speaking with the correct person using two identifiers.  Location patient: home Location provider:work or home office Persons participating in the virtual visit: patient, provider  I discussed the limitations of evaluation and management by telemedicine and the availability of in person appointments. The patient expressed understanding and agreed to proceed.   HPI:  Mathea Frieling presents for approximately 3 to 4-week history of coughing.  Occasionally productive.  No fever.  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  She does have some allergies and postnasal drip symptoms.  Is using Zyrtec sporadically.  No recent dyspnea.  She does feel like she is having some wheezing.  She has albuterol which she uses as needed.  Does have history of asthma which sounds like mild intermittent asthma.  Occasionally takes Flonase for postnasal drip symptoms.  Non-smoker.  Exercises regularly.  ROS: See pertinent positives and negatives per HPI.  Past Medical History:  Diagnosis Date   Anemia    Asthma     Past Surgical History:  Procedure Laterality Date   DENTAL SURGERY      Family History  Problem Relation Age of Onset   Hypertension Mother    Hypertension Father     SOCIAL HX: Non-smoker   Current Outpatient Medications:    albuterol (VENTOLIN HFA) 108 (90 Base) MCG/ACT inhaler, SMARTSIG:2 Puff(s) By Mouth Every 6 Hours, Disp: , Rfl:    cetirizine (ZYRTEC) 10 MG tablet, Take 10 mg by mouth daily.,  Disp: , Rfl:    Ferrous Sulfate (IRON PO), Take by mouth., Disp: , Rfl:    fluticasone (FLONASE) 50 MCG/ACT nasal spray, Place 2 sprays into both nostrils daily., Disp: , Rfl:    predniSONE (DELTASONE) 20 MG tablet, Take two tablets by mouth once daily for 5 days., Disp: 10 tablet, Rfl: 0   triamcinolone cream (KENALOG) 0.1 %, APPLY EXTERNALLY TWO TIMES A DAY, Disp: 30 g, Rfl: 1  EXAM:  VITALS per patient if applicable:  GENERAL: alert, oriented, appears well and in no acute distress  HEENT: atraumatic, conjunttiva clear, no obvious abnormalities on inspection of external nose and ears  NECK: normal movements of the head and neck  LUNGS: on inspection no signs of respiratory distress, breathing rate appears normal, no obvious gross SOB, gasping or wheezing  CV: no obvious cyanosis  MS: moves all visible extremities without noticeable abnormality  PSYCH/NEURO: pleasant and cooperative, no obvious depression or anxiety, speech and thought processing grossly intact  ASSESSMENT AND PLAN:  Discussed the following assessment and plan:  Patient relates 3 to 4-week history of cough.  She has history of some reactive airway disease issues.  No respiratory distress.  No fever.  Multiple COVID tests have been negative.  -Continue Zyrtec and Flonase -Recommend prednisone 20 mg 2 tablets daily for 5 days and if not improving after that recommend office follow-up to further assess     I discussed the assessment and treatment plan with the patient. The patient was provided an opportunity to ask  questions and all were answered. The patient agreed with the plan and demonstrated an understanding of the instructions.   The patient was advised to call back or seek an in-person evaluation if the symptoms worsen or if the condition fails to improve as anticipated.     Evelena Peat, MD

## 2020-10-10 ENCOUNTER — Other Ambulatory Visit: Payer: Self-pay

## 2020-10-10 ENCOUNTER — Telehealth: Payer: BC Managed Care – PPO | Admitting: Internal Medicine

## 2020-10-10 ENCOUNTER — Encounter: Payer: Self-pay | Admitting: Family Medicine

## 2020-10-10 ENCOUNTER — Ambulatory Visit: Payer: BC Managed Care – PPO | Admitting: Family Medicine

## 2020-10-10 VITALS — BP 120/60 | HR 98 | Temp 98.2°F | Wt 136.5 lb

## 2020-10-10 DIAGNOSIS — R21 Rash and other nonspecific skin eruption: Secondary | ICD-10-CM

## 2020-10-10 NOTE — Patient Instructions (Signed)
Suspect local allergic reaction  Try the triamcinolone cream if itching recurs.

## 2020-10-10 NOTE — Progress Notes (Signed)
Established Patient Office Visit  Subjective:  Patient ID: Amanda Ortega, female    DOB: [birth year]  Age: 30 y.o. MRN: [id number]  CC:  Chief Complaint  Patient presents with   Cyst    Under right arm, x 3 days, no pain, swelling has started to go down    HPI Amanda Ortega presents for recent area of erythema right axillary region.  Noted about 3 days ago.  She initially thought this may be some sort of "cyst".  No pain.  Did notice some itching.  Not aware of any bites.  No fevers or chills.  No known history of recent tick bite.  No adenopathy noted.  No other rashes.  Itching has improved and redness has almost resolved today.  Past Medical History:  Diagnosis Date   Anemia    Asthma     Past Surgical History:  Procedure Laterality Date   DENTAL SURGERY      Family History  Problem Relation Age of Onset   Hypertension Mother    Hypertension Father     Social History   Socioeconomic History   Marital status: Single    Spouse name: Not on file   Number of children: Not on file   Years of education: Not on file   Highest education level: Not on file  Occupational History   Not on file  Tobacco Use   Smoking status: Never   Smokeless tobacco: Never  Substance and Sexual Activity   Alcohol use: Yes    Comment: social   Drug use: Never   Sexual activity: Not Currently    Partners: Male    Birth control/protection: Abstinence  Other Topics Concern   Not on file  Social History Narrative   Not on file   Social Determinants of Health   Financial Resource Strain: Not on file  Food Insecurity: Not on file  Transportation Needs: Not on file  Physical Activity: Not on file  Stress: Not on file  Social Connections: Not on file  Intimate Partner Violence: Not on file    Outpatient Medications Prior to Visit  Medication Sig Dispense Refill   albuterol (VENTOLIN HFA) 108 (90 Base) MCG/ACT inhaler SMARTSIG:2 Puff(s) By Mouth Every 6 Hours     cetirizine  (ZYRTEC) 10 MG tablet Take 10 mg by mouth daily.     Ferrous Sulfate (IRON PO) Take by mouth.     fluticasone (FLONASE) 50 MCG/ACT nasal spray Place 2 sprays into both nostrils daily.     predniSONE (DELTASONE) 20 MG tablet Take two tablets by mouth once daily for 5 days. 10 tablet 0   triamcinolone cream (KENALOG) 0.1 % APPLY EXTERNALLY TWO TIMES A DAY 30 g 1   No facility-administered medications prior to visit.    Allergies  Allergen Reactions   Shellfish Allergy Swelling    Lip swelling    Naproxen Hives and Swelling    Tongue swelling    ROS Review of Systems  Constitutional:  Negative for chills and fever.  Skin:  Positive for rash.  Neurological:  Negative for headaches.     Objective:    Physical Exam Vitals reviewed.  Constitutional:      Appearance: Normal appearance.  Cardiovascular:     Rate and Rhythm: Normal rate and regular rhythm.  Pulmonary:     Effort: Pulmonary effort is normal.     Breath sounds: Normal breath sounds. No wheezing.  Skin:    Comments: Very faint oval area of erythema  which blanches with pressure approximately 2 x 2 centimeters right axillary region.  No induration.  No fluctuance.  Nontender.  No warmth.  Nonscaly.  Nonvesicular.  No pustules.  Neurological:     Mental Status: She is alert.    BP 120/60 (BP Location: Left Arm, Patient Position: Sitting, Cuff Size: Normal)   Pulse 98   Temp 98.2 F (36.8 C) (Oral)   Wt 136 lb 8 oz (61.9 kg)   SpO2 100%   BMI 23.07 kg/m  Wt Readings from Last 3 Encounters:  10/10/20 136 lb 8 oz (61.9 kg)  08/24/20 141 lb (64 kg)  08/01/20 141 lb (64 kg)     Health Maintenance Due  Topic Date Due   Pneumococcal Vaccine 26-60 Years old (1 - PCV) Never done   Hepatitis C Screening  Never done   COVID-19 Vaccine (3 - Moderna risk series) 11/18/2019    There are no preventive care reminders to display for this patient.  Lab Results  Component Value Date   TSH 1.02 08/28/2020   Lab  Results  Component Value Date   WBC 4.7 08/28/2020   HGB 12.0 08/28/2020   HCT 36.0 08/28/2020   MCV 93.9 08/28/2020   PLT 218.0 08/28/2020   No results found for: NA, K, CHLORIDE, CO2, GLUCOSE, BUN, CREATININE, BILITOT, ALKPHOS, AST, ALT, PROT, ALBUMIN, CALCIUM, ANIONGAP, EGFR, GFR No results found for: CHOL No results found for: HDL No results found for: LDLCALC No results found for: TRIG No results found for: CHOLHDL No results found for: HGBA1C    Assessment & Plan:   Skin rash right axillary region.  No evidence for cyst or abscess.  Suspect some sort of local allergic reaction such as bite.  This appears to be resolving  -Patient has triamcinolone cream at home and will use this for any recurrent itching or other concerns.  Reassurance given  No orders of the defined types were placed in this encounter.   Follow-up: No follow-ups on file.    Carolann Littler, MD

## 2020-12-10 ENCOUNTER — Other Ambulatory Visit: Payer: Self-pay

## 2020-12-11 ENCOUNTER — Other Ambulatory Visit (HOSPITAL_COMMUNITY)
Admission: RE | Admit: 2020-12-11 | Discharge: 2020-12-11 | Disposition: A | Discharge status: Home / Self Care | Payer: BC Managed Care – PPO | Source: Ambulatory Visit | Attending: Family Medicine | Admitting: Family Medicine

## 2020-12-11 ENCOUNTER — Encounter: Payer: Self-pay | Admitting: Family Medicine

## 2020-12-11 ENCOUNTER — Ambulatory Visit (INDEPENDENT_AMBULATORY_CARE_PROVIDER_SITE_OTHER): Payer: BC Managed Care – PPO | Admitting: Family Medicine

## 2020-12-11 VITALS — BP 100/68 | HR 104 | Temp 98.6°F | Wt 140.5 lb

## 2020-12-11 DIAGNOSIS — N898 Other specified noninflammatory disorders of vagina: Secondary | ICD-10-CM | POA: Insufficient documentation

## 2020-12-11 LAB — POCT URINALYSIS DIPSTICK
Bilirubin, UA: NEGATIVE
Blood, UA: NEGATIVE
Glucose, UA: NEGATIVE
Ketones, UA: NEGATIVE
Leukocytes, UA: NEGATIVE
Nitrite, UA: NEGATIVE
Protein, UA: NEGATIVE
Spec Grav, UA: >=1.03 — AB (ref 1.010–1.025)
Urobilinogen, UA: NEGATIVE E.U./dL — AB
pH, UA: 6 (ref 5.0–8.0)

## 2020-12-11 MED ORDER — METRONIDAZOLE 500 MG PO TABS: 500.0000 mg | ORAL_TABLET | Freq: Two times a day (BID) | ORAL | 0 refills | Status: AC

## 2020-12-11 NOTE — Progress Notes (Signed)
 Established Patient Office Visit  Subjective:  Patient ID: Amanda Ortega, female    DOB: 07/25/1990  Age: 30 y.o. MRN: [id number]  CC:  Chief Complaint  Patient presents with   Vaginal Discharge    Vaginal odor- sour, discharge is thicker    HPI Amanda Ortega presents for odorous vaginal discharge for the past week or so.  She has 1 partner has had the same partner for months.  Denies any prior history of STD.  No fevers or chills.  No dysuria.  Her discharge is somewhat thicker and she is noted somewhat of a fishy odor.  She has had bacterial vaginosis in the past.  She was treated with topical and had adverse reaction with pruritus.  She thinks she has tolerated oral metronidazole without difficulty.  Past Medical History:  Diagnosis Date   Anemia    Asthma     Past Surgical History:  Procedure Laterality Date   DENTAL SURGERY      Family History  Problem Relation Age of Onset   Hypertension Mother    Hypertension Father     Social History   Socioeconomic History   Marital status: Single    Spouse name: Not on file   Number of children: Not on file   Years of education: Not on file   Highest education level: Not on file  Occupational History   Not on file  Tobacco Use   Smoking status: Never   Smokeless tobacco: Never  Substance and Sexual Activity   Alcohol use: Yes    Comment: social   Drug use: Never   Sexual activity: Not Currently    Partners: Male    Birth control/protection: Abstinence  Other Topics Concern   Not on file  Social History Narrative   Not on file   Social Determinants of Health   Financial Resource Strain: Not on file  Food Insecurity: Not on file  Transportation Needs: Not on file  Physical Activity: Not on file  Stress: Not on file  Social Connections: Not on file  Intimate Partner Violence: Not on file    Outpatient Medications Prior to Visit  Medication Sig Dispense Refill   albuterol (VENTOLIN HFA) 108 (90 Base)  MCG/ACT inhaler SMARTSIG:2 Puff(s) By Mouth Every 6 Hours     cetirizine (ZYRTEC) 10 MG tablet Take 10 mg by mouth daily.     Ferrous Sulfate (IRON PO) Take by mouth.     fluticasone (FLONASE) 50 MCG/ACT nasal spray Place 2 sprays into both nostrils daily.     triamcinolone cream (KENALOG) 0.1 % APPLY EXTERNALLY TWO TIMES A DAY 30 g 1   predniSONE (DELTASONE) 20 MG tablet Take two tablets by mouth once daily for 5 days. (Patient not taking: Reported on 12/11/2020) 10 tablet 0   No facility-administered medications prior to visit.    Allergies  Allergen Reactions   Shellfish Allergy Swelling    Lip swelling    Naproxen Hives and Swelling    Tongue swelling    ROS Review of Systems  Constitutional:  Negative for chills and fever.  Genitourinary:  Positive for vaginal discharge. Negative for dysuria, genital sores, pelvic pain, vaginal bleeding and vaginal pain.     Objective:    Physical Exam Constitutional:      Appearance: Normal appearance.  Cardiovascular:     Rate and Rhythm: Normal rate and regular rhythm.  Neurological:     Mental Status: She is alert.    BP 100/68 (BP Location:   Right Arm, Patient Position: Sitting, Cuff Size: Normal)   Pulse (!) 104   Temp 98.6 F (37 C) (Oral)   Wt 140 lb 8 oz (63.7 kg)   SpO2 98%   BMI 23.74 kg/m  Wt Readings from Last 3 Encounters:  12/11/20 140 lb 8 oz (63.7 kg)  10/10/20 136 lb 8 oz (61.9 kg)  08/24/20 141 lb (64 kg)     Health Maintenance Due  Topic Date Due   Hepatitis C Screening  Never done   COVID-19 Vaccine (3 - Moderna risk series) 11/18/2019   INFLUENZA VACCINE  Never done    There are no preventive care reminders to display for this patient.  Lab Results  Component Value Date   TSH 1.02 08/28/2020   Lab Results  Component Value Date   WBC 4.7 08/28/2020   HGB 12.0 08/28/2020   HCT 36.0 08/28/2020   MCV 93.9 08/28/2020   PLT 218.0 08/28/2020   No results found for: NA, K, CHLORIDE, CO2, GLUCOSE,  BUN, CREATININE, BILITOT, ALKPHOS, AST, ALT, PROT, ALBUMIN, CALCIUM, ANIONGAP, EGFR, GFR No results found for: CHOL No results found for: HDL No results found for: LDLCALC No results found for: TRIG No results found for: CHOLHDL No results found for: HGBA1C    Assessment & Plan:   Problem List Items Addressed This Visit   None Visit Diagnoses     Vaginal odor    -  Primary   Relevant Orders   POCT urinalysis dipstick (Completed)   Vaginal discharge       Relevant Orders   Urine cytology ancillary only      Rule out bacterial vaginosis.  Urine cytology obtained-we will screen for trichomonas, GC, chlamydia, Gardnerella, Candida.  We decided to go ahead and treat empirically with metronidazole 500 mg twice daily for 7 days pending results Meds ordered this encounter  Medications   metroNIDAZOLE (FLAGYL) 500 MG tablet    Sig: Take 1 tablet (500 mg total) by mouth 2 (two) times daily for 7 days.    Dispense:  14 tablet    Refill:  0    Follow-up: No follow-ups on file.    Bruce Burchette, MD 

## 2020-12-12 LAB — CERVICOVAGINAL ANCILLARY ONLY
Bacterial Vaginitis (gardnerella): POSITIVE — AB
Comment: NEGATIVE

## 2020-12-14 LAB — URINE CYTOLOGY ANCILLARY ONLY
Bacterial Vaginitis-Urine: NEGATIVE
Candida Urine: NEGATIVE
Chlamydia: NEGATIVE
Comment: NEGATIVE
Comment: NEGATIVE
Comment: NORMAL
Neisseria Gonorrhea: NEGATIVE
Trichomonas: NEGATIVE

## 2020-12-24 ENCOUNTER — Encounter: Payer: Self-pay | Admitting: Internal Medicine

## 2021-01-08 ENCOUNTER — Telehealth: Payer: Self-pay | Admitting: Internal Medicine

## 2021-01-08 NOTE — Telephone Encounter (Signed)
Physician Statement to be filled out, handed to [place].  Call [phone] upon completion.

## 2021-01-08 NOTE — Telephone Encounter (Signed)
Placed on Dr Hernandez's desk 

## 2021-01-11 NOTE — Telephone Encounter (Signed)
Patient states she will get the form filled out elsewhere.  Placed in holding folder.

## 2021-01-15 ENCOUNTER — Telehealth: Payer: Self-pay | Admitting: Internal Medicine

## 2021-01-15 NOTE — Telephone Encounter (Signed)
I spoke with the pt and I informed her that she has not had hepatitis B vaccination. Pt stated she would call back to schedule an appointment if necessary.

## 2021-01-15 NOTE — Telephone Encounter (Signed)
Patient is needing a Hep B injection for her new job, but wants info on what immunizations she she have at her age.  She just went on her own insurance.  She is needing a call back as soon as possible.

## 2021-01-25 ENCOUNTER — Other Ambulatory Visit: Payer: Self-pay | Admitting: Family Medicine

## 2021-05-27 ENCOUNTER — Telehealth: Payer: Self-pay | Admitting: Internal Medicine

## 2021-05-27 DIAGNOSIS — Z111 Encounter for screening for respiratory tuberculosis: Secondary | ICD-10-CM

## 2021-05-27 NOTE — Telephone Encounter (Signed)
Patient needs TB test for job. ?

## 2021-05-27 NOTE — Telephone Encounter (Signed)
Left message on machine to schedule a lab appointment ?

## 2021-05-27 NOTE — Telephone Encounter (Signed)
Quantiferon gold okay? ?

## 2021-05-29 NOTE — Telephone Encounter (Signed)
Left message on machine for patient to return our call 

## 2021-05-30 NOTE — Telephone Encounter (Signed)
Lab appointment scheduled 

## 2021-05-31 ENCOUNTER — Other Ambulatory Visit: Payer: Self-pay

## 2021-05-31 DIAGNOSIS — Z111 Encounter for screening for respiratory tuberculosis: Secondary | ICD-10-CM

## 2021-06-03 ENCOUNTER — Ambulatory Visit: Payer: BC Managed Care – PPO

## 2021-06-04 ENCOUNTER — Telehealth: Payer: Self-pay | Admitting: Internal Medicine

## 2021-06-04 NOTE — Telephone Encounter (Signed)
Pt is calling and would like result of tb blood work also would like a copy ?

## 2021-06-05 LAB — QUANTIFERON-TB GOLD PLUS
Mitogen-NIL: >10 IU/mL
NIL: 0.03 IU/mL
QuantiFERON-TB Gold Plus: NEGATIVE
TB1-NIL: <0 IU/mL
TB2-NIL: 0 IU/mL

## 2021-06-05 NOTE — Telephone Encounter (Signed)
Quantiferon gold ordered 05/31/21 ?

## 2021-06-05 NOTE — Telephone Encounter (Signed)
Called [facility] and the result will be faxed. ?

## 2021-06-06 NOTE — Telephone Encounter (Signed)
Copy is ready for pick up and patient is aware. ?

## 2021-08-01 ENCOUNTER — Ambulatory Visit: Payer: BC Managed Care – PPO | Admitting: Nurse Practitioner

## 2021-08-07 ENCOUNTER — Ambulatory Visit: Payer: BC Managed Care – PPO | Admitting: Nurse Practitioner

## 2021-08-29 ENCOUNTER — Encounter: Payer: Self-pay | Admitting: Internal Medicine

## 2021-08-29 ENCOUNTER — Other Ambulatory Visit (HOSPITAL_COMMUNITY)
Admission: RE | Admit: 2021-08-29 | Discharge: 2021-08-29 | Disposition: A | Discharge status: Home / Self Care | Payer: 59 | Source: Ambulatory Visit | Attending: Internal Medicine | Admitting: Internal Medicine

## 2021-08-29 ENCOUNTER — Ambulatory Visit (INDEPENDENT_AMBULATORY_CARE_PROVIDER_SITE_OTHER): Payer: 59 | Admitting: Internal Medicine

## 2021-08-29 VITALS — BP 102/70 | HR 97 | Temp 98.3°F | Ht 66.0 in | Wt 141.1 lb

## 2021-08-29 DIAGNOSIS — Z113 Encounter for screening for infections with a predominantly sexual mode of transmission: Secondary | ICD-10-CM | POA: Diagnosis not present

## 2021-08-29 DIAGNOSIS — Z124 Encounter for screening for malignant neoplasm of cervix: Secondary | ICD-10-CM | POA: Diagnosis present

## 2021-08-29 DIAGNOSIS — D509 Iron deficiency anemia, unspecified: Secondary | ICD-10-CM | POA: Diagnosis not present

## 2021-08-29 DIAGNOSIS — Z202 Contact with and (suspected) exposure to infections with a predominantly sexual mode of transmission: Secondary | ICD-10-CM | POA: Insufficient documentation

## 2021-08-29 DIAGNOSIS — Z0000 Encounter for general adult medical examination without abnormal findings: Secondary | ICD-10-CM

## 2021-08-29 LAB — CBC WITH DIFFERENTIAL/PLATELET
Basophils Absolute: 0 10*3/uL (ref 0.0–0.1)
Basophils Relative: 0.5 % (ref 0.0–3.0)
Eosinophils Absolute: 0.1 10*3/uL (ref 0.0–0.7)
Eosinophils Relative: 1.5 % (ref 0.0–5.0)
HCT: 35.1 % — ABNORMAL LOW (ref 36.0–46.0)
Hemoglobin: 11.1 g/dL — ABNORMAL LOW (ref 12.0–15.0)
Lymphocytes Relative: 23.9 % (ref 12.0–46.0)
Lymphs Abs: 1.4 10*3/uL (ref 0.7–4.0)
MCHC: 31.6 g/dL (ref 30.0–36.0)
MCV: 84 fl (ref 78.0–100.0)
Monocytes Absolute: 0.4 10*3/uL (ref 0.1–1.0)
Monocytes Relative: 7.3 % (ref 3.0–12.0)
Neutro Abs: 4 10*3/uL (ref 1.4–7.7)
Neutrophils Relative %: 66.8 % (ref 43.0–77.0)
Platelets: 288 10*3/uL (ref 150.0–400.0)
RBC: 4.18 Mil/uL (ref 3.87–5.11)
RDW: 16 % — ABNORMAL HIGH (ref 11.5–15.5)
WBC: 6 10*3/uL (ref 4.0–10.5)

## 2021-08-29 LAB — IBC PANEL
Iron: 39 ug/dL — ABNORMAL LOW (ref 42–145)
Saturation Ratios: 7.8 % — ABNORMAL LOW (ref 20.0–50.0)
TIBC: 498.4 ug/dL — ABNORMAL HIGH (ref 250.0–450.0)
Transferrin: 356 mg/dL (ref 212.0–360.0)

## 2021-08-29 LAB — COMPREHENSIVE METABOLIC PANEL
ALT: 16 U/L (ref 0–35)
AST: 22 U/L (ref 0–37)
Albumin: 4.5 g/dL (ref 3.5–5.2)
Alkaline Phosphatase: 49 U/L (ref 39–117)
BUN: 12 mg/dL (ref 6–23)
CO2: 26 mEq/L (ref 19–32)
Calcium: 9.6 mg/dL (ref 8.4–10.5)
Chloride: 104 mEq/L (ref 96–112)
Creatinine, Ser: 0.75 mg/dL (ref 0.40–1.20)
GFR: 108.2 mL/min (ref >60.00)
Glucose, Bld: 72 mg/dL (ref 70–99)
Potassium: 3.3 mEq/L — ABNORMAL LOW (ref 3.5–5.1)
Sodium: 138 mEq/L (ref 135–145)
Total Bilirubin: 0.4 mg/dL (ref 0.2–1.2)
Total Protein: 7.7 g/dL (ref 6.0–8.3)

## 2021-08-29 LAB — FERRITIN: Ferritin: 3.1 ng/mL — ABNORMAL LOW (ref 10.0–291.0)

## 2021-08-29 NOTE — Progress Notes (Signed)
Established Patient Office Visit     CC/Reason for Visit: Annual preventive exam, yearly follow-up chronic conditions, requesting STD screening  HPI: Conner Mollet is a 31 y.o. female who is coming in today for the above mentioned reasons. Past Medical History is significant for: Iron deficiency anemia.  She is doing well.  She has routine dental care but is overdue for an eye exam.  She is overdue for a COVID vaccination.  She is requesting STD screening today.  She is due for a Pap smear.  Unfortunately her mother was diagnosed with colon cancer last year and has completed treatment.  She was advised to have an initial colonoscopy at age 49.  Past Medical/Surgical History: Past Medical History:  Diagnosis Date   Anemia    Asthma     Past Surgical History:  Procedure Laterality Date   DENTAL SURGERY      Social History:  reports that she has never smoked. She has never used smokeless tobacco. She reports current alcohol use. She reports that she does not use drugs.  Allergies: Allergies  Allergen Reactions   Shellfish Allergy Swelling    Lip swelling    Naproxen Hives and Swelling    Tongue swelling    Family History:  Family History  Problem Relation Age of Onset   Hypertension Mother    Hypertension Father      Current Outpatient Medications:    albuterol (VENTOLIN HFA) 108 (90 Base) MCG/ACT inhaler, SMARTSIG:2 Puff(s) By Mouth Every 6 Hours, Disp: , Rfl:    cetirizine (ZYRTEC) 10 MG tablet, Take 10 mg by mouth daily., Disp: , Rfl:    Ferrous Sulfate (IRON PO), Take by mouth., Disp: , Rfl:    fluticasone (FLONASE) 50 MCG/ACT nasal spray, Place 2 sprays into both nostrils daily., Disp: , Rfl:    predniSONE (DELTASONE) 20 MG tablet, Take two tablets by mouth once daily for 5 days. (Patient not taking: Reported on 12/11/2020), Disp: 10 tablet, Rfl: 0  Review of Systems:  Constitutional: Denies fever, chills, diaphoresis, appetite change and fatigue.  HEENT:  Denies photophobia, eye pain, redness, hearing loss, ear pain, congestion, sore throat, rhinorrhea, sneezing, mouth sores, trouble swallowing, neck pain, neck stiffness and tinnitus.   Respiratory: Denies SOB, DOE, cough, chest tightness,  and wheezing.   Cardiovascular: Denies chest pain, palpitations and leg swelling.  Gastrointestinal: Denies nausea, vomiting, abdominal pain, diarrhea, constipation, blood in stool and abdominal distention.  Genitourinary: Denies dysuria, urgency, frequency, hematuria, flank pain and difficulty urinating.  Endocrine: Denies: hot or cold intolerance, sweats, changes in hair or nails, polyuria, polydipsia. Musculoskeletal: Denies myalgias, back pain, joint swelling, arthralgias and gait problem.  Skin: Denies pallor, rash and wound.  Neurological: Denies dizziness, seizures, syncope, weakness, light-headedness, numbness and headaches.  Hematological: Denies adenopathy. Easy bruising, personal or family bleeding history  Psychiatric/Behavioral: Denies suicidal ideation, mood changes, confusion, nervousness, sleep disturbance and agitation    Physical Exam: Vitals:   08/29/21 1114  BP: 102/70  Pulse: 97  Temp: 98.3 F (36.8 C)  TempSrc: Oral  SpO2: 99%  Weight: 141 lb 1.6 oz (64 kg)  Height: 5\' 6"  (1.676 m)    Body mass index is 22.77 kg/m.   Constitutional: NAD, calm, comfortable Eyes: PERRL, lids and conjunctivae normal ENMT: Mucous membranes are moist. Posterior pharynx clear of any exudate or lesions. Normal dentition. Tympanic membrane is pearly white, no erythema or bulging. Neck: normal, supple, no masses, no thyromegaly Respiratory: clear to auscultation bilaterally,  no wheezing, no crackles. Normal respiratory effort. No accessory muscle use.  Cardiovascular: Regular rate and rhythm, no murmurs / rubs / gallops. No extremity edema. 2+ pedal pulses. No carotid bruits.  Abdomen: no tenderness, no masses palpated. No hepatosplenomegaly. Bowel  sounds positive.  Musculoskeletal: no clubbing / cyanosis. No joint deformity upper and lower extremities. Good ROM, no contractures. Normal muscle tone.  Skin: no rashes, lesions, ulcers. No induration Neurologic: CN 2-12 grossly intact. Sensation intact, DTR normal. Strength 5/5 in all 4.  Psychiatric: Normal judgment and insight. Alert and oriented x 3. Normal mood.    Impression and Plan:  Encounter for preventive health examination  - Plan: Comprehensive metabolic panel -Recommend routine eye and dental care. -Immunizations: She will get COVID bivalent vaccine at pharmacy -Healthy lifestyle discussed in detail. -Labs to be updated today. -Colon cancer screening: Commence age 50 due to mother with colon cancer -Breast cancer screening: Commence age 27 -Cervical cancer screening: In office today -Lung cancer screening: Not applicable -Prostate cancer screening: Not applicable -DEXA: Not applicable  Screening for cervical cancer  - Plan: PAP [[place]]  Screen for STD (sexually transmitted disease)  - Plan: Cervicovaginal ancillary only, HIV antibody (with reflex), RPR, Hepatitis Acute Panel  Iron deficiency anemia, unspecified iron deficiency anemia type  - Plan: CBC with Differential/Platelet, Ferritin, IBC panel    Patient Instructions  -Nice seeing you today!!  -Lab work today; will notify you once results are available.  -Remember your COVID vaccine.  -Schedule follow up in 1 year or sooner as needed.      Lelon Frohlich, MD [facility]

## 2021-08-29 NOTE — Patient Instructions (Signed)
-  Nice seeing you today!!  -Lab work today; will notify you once results are available.  -Remember your COVID vaccine.  -Schedule follow up in 1 year or sooner as needed.   

## 2021-08-30 LAB — CYTOLOGY - PAP
Comment: NEGATIVE
Diagnosis: NEGATIVE
High risk HPV: NEGATIVE

## 2021-08-30 LAB — HEPATITIS PANEL, ACUTE
Hep A IgM: NONREACTIVE
Hep B C IgM: NONREACTIVE
Hepatitis B Surface Ag: NONREACTIVE
Hepatitis C Ab: NONREACTIVE
SIGNAL TO CUT-OFF: 0.09 (ref <1.00)

## 2021-08-30 LAB — HIV ANTIBODY (ROUTINE TESTING W REFLEX): HIV 1&2 Ab, 4th Generation: NONREACTIVE

## 2021-08-30 LAB — RPR: RPR Ser Ql: NONREACTIVE

## 2021-09-02 ENCOUNTER — Encounter: Payer: Self-pay | Admitting: Internal Medicine

## 2021-09-02 LAB — CERVICOVAGINAL ANCILLARY ONLY
Bacterial Vaginitis (gardnerella): POSITIVE — AB
Candida Glabrata: NEGATIVE
Candida Vaginitis: NEGATIVE
Chlamydia: NEGATIVE
Comment: NEGATIVE
Comment: NEGATIVE
Comment: NEGATIVE
Comment: NEGATIVE
Comment: NEGATIVE
Comment: NORMAL
Neisseria Gonorrhea: NEGATIVE
Trichomonas: NEGATIVE

## 2021-09-04 ENCOUNTER — Ambulatory Visit: Payer: BC Managed Care – PPO | Admitting: Nurse Practitioner

## 2021-09-04 NOTE — Telephone Encounter (Signed)
Patient called to follow up on prescription for abnormal reading in September. I let patient know that her message had been sent back but we were waiting on Dr.Hernandez to respond back with a decision.

## 2021-09-05 ENCOUNTER — Ambulatory Visit: Payer: BC Managed Care – PPO | Admitting: Nurse Practitioner

## 2021-09-05 ENCOUNTER — Other Ambulatory Visit: Payer: Self-pay | Admitting: *Deleted

## 2021-09-05 MED ORDER — IRON (FERROUS SULFATE) 325 (65 FE) MG PO TABS: 325.0000 mg | ORAL_TABLET | Freq: Two times a day (BID) | ORAL | 2 refills | Status: DC

## 2021-09-05 MED ORDER — METRONIDAZOLE 500 MG PO TABS: ORAL_TABLET | ORAL | 0 refills | Status: DC

## 2021-09-05 NOTE — Telephone Encounter (Signed)
Patient is aware 

## 2021-09-06 ENCOUNTER — Telehealth: Payer: Self-pay | Admitting: Internal Medicine

## 2021-09-06 NOTE — Telephone Encounter (Signed)
Pharmacy called to get clarification on the prescription for metroNIDAZOLE (FLAGYL) 500 MG tablet. Prescription is written for four tablets a day but only has quantity of one tablet.       Please send to   [facility] - [place] - [address] Phone:  [phone]  Fax:  [phone]        Please advise

## 2021-09-09 MED ORDER — METRONIDAZOLE 500 MG PO TABS: ORAL_TABLET | ORAL | 0 refills | Status: DC

## 2021-09-09 NOTE — Telephone Encounter (Signed)
New Rx sent.

## 2021-10-01 ENCOUNTER — Other Ambulatory Visit: Payer: Self-pay | Admitting: Internal Medicine

## 2021-10-01 ENCOUNTER — Encounter: Payer: Self-pay | Admitting: Nurse Practitioner

## 2021-10-01 ENCOUNTER — Ambulatory Visit (INDEPENDENT_AMBULATORY_CARE_PROVIDER_SITE_OTHER): Payer: 59 | Admitting: Nurse Practitioner

## 2021-10-01 VITALS — BP 98/68 | HR 89 | Ht 64.75 in | Wt 145.0 lb

## 2021-10-01 DIAGNOSIS — N926 Irregular menstruation, unspecified: Secondary | ICD-10-CM

## 2021-10-01 DIAGNOSIS — Z01419 Encounter for gynecological examination (general) (routine) without abnormal findings: Secondary | ICD-10-CM

## 2021-10-01 MED ORDER — FLUTICASONE PROPIONATE 50 MCG/ACT NA SUSP: 2.0000 | Freq: Every day | NASAL | 0 refills | Status: DC

## 2021-10-01 NOTE — Progress Notes (Signed)
   Amanda Ortega Mar 23, 1991 [id number]   History:  31 y.o. G0 presents for annual exam. Monthly cycles. Menses were a week late this month and it was only spotting yesterday. Negative home UPT 09/29/21. Normal pap history. History of anemia secondary to heavy menses, taking OTC iron supplement. Sexually active with one partner. Is not interested in contraception at this time. Negative STD screening 08/29/2021 with PCP.   Gynecologic History Patient's last menstrual period was 09/30/2021 (exact date). Period Cycle (Days):  (28) Period Duration (Days): 4 Menstrual Flow:  (varies) Menstrual Control: Tampon, Maxi pad Dysmenorrhea: (!) Mild Dysmenorrhea Symptoms: Cramping Contraception/Family planning: none Sexually active: Yes  Health Maintenance Last Pap: 08/29/2021. Results were: Normal Last mammogram: Not indicated Last colonoscopy: Not indicated Last Dexa: Not indicated  Past medical history, past surgical history, family history and social history were all reviewed and documented in the EPIC chart. 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 Takes GRE in a couple of weeks. Mother diagnosed with colon cancer at age 51, stage 3.   ROS:  A ROS was performed and pertinent positives and negatives are included.  Exam:  Vitals:   10/01/21 1610  BP: 98/68  Pulse: 89  SpO2: 97%  Weight: 145 lb (65.8 kg)  Height: 5' 4.75" (1.645 m)    Body mass index is 24.32 kg/m.  General appearance:  Normal Thyroid:  Symmetrical, normal in size, without palpable masses or nodularity. Respiratory  Auscultation:  Clear without wheezing or rhonchi Cardiovascular  Auscultation:  Regular rate, without rubs, murmurs or gallops  Edema/varicosities:  Not grossly evident Abdominal  Soft,nontender, without masses, guarding or rebound.  Liver/spleen:  No organomegaly noted  Hernia:  None appreciated  Skin  Inspection:  Grossly normal Breasts: Examined lying and  sitting.   Right: Without masses, retractions, nipple discharge or axillary adenopathy.   Left: Without masses, retractions, nipple discharge or axillary adenopathy. Genitourinary Not indicated  Patient informed chaperone available to be present for breast and pelvic exam. Patient has requested no chaperone to be present. Patient has been advised what will be completed during breast and pelvic exam.   Assessment/Plan:  31 y.o. G0 for annual exam.   Well female exam with routine gynecological exam - Education provided on SBEs, importance of preventative screenings, current guidelines, high calcium diet, regular exercise, and multivitamin daily. Labs with PCP.   Late menses - Plan: hCG, quantitative, pregnancy  Screening for cervical cancer - Normal pap history. Will repeat at 3-year interval per guidelines.   Return in 1 year for annual.    Olivia Mackie DNP, 4:29 PM 10/01/2021

## 2021-10-02 LAB — HCG, QUANTITATIVE, PREGNANCY: HCG, Total, QN: <5 m[IU]/mL

## 2021-10-10 ENCOUNTER — Telehealth: Payer: 59 | Admitting: Physician Assistant

## 2021-10-10 DIAGNOSIS — L309 Dermatitis, unspecified: Secondary | ICD-10-CM

## 2021-10-10 DIAGNOSIS — B3731 Acute candidiasis of vulva and vagina: Secondary | ICD-10-CM

## 2021-10-10 MED ORDER — TRIAMCINOLONE ACETONIDE 0.1 % EX CREA: 1.0000 | TOPICAL_CREAM | Freq: Two times a day (BID) | CUTANEOUS | 0 refills | Status: DC

## 2021-10-10 MED ORDER — FLUCONAZOLE 150 MG PO TABS: 150.0000 mg | ORAL_TABLET | ORAL | 0 refills | Status: DC | PRN

## 2021-10-10 NOTE — Progress Notes (Signed)
E-Visit for Eczema ? ?We are sorry that you are not feeling well. Here is how we plan to help! ?Based on what you shared with me it looks like you have eczema (atopic dermatitis).  Although the cause of eczema is not completely understood, genetics appear to play a strong role, and people with a family history of eczema are at increased risk of developing the condition. In most people with eczema, there is a genetic abnormality in the outermost layer of the skin, called the epidermis  ? ?Most people with eczema develop their first symptoms as children, before the age of five. Intense itching of the skin, patches of redness, small bumps, and skin flaking are common. Scratching can further inflame the skin and worsen the itching. The itchiness may be more noticeable at nighttime. ? ?Eczema commonly affects the back of the neck, the elbow creases, and the backs of the knees. Other affected areas may include the face, wrists, and forearms. The skin may become thickened and darkened, or even scarred, from repeated scratching. ?Eliminating factors that aggravate your eczema symptoms can help to control the symptoms. Possible triggers may include: ?? Cold or dry environments ?? Sweating ?? Emotional stress or anxiety ?? Rapid temperature changes ?? Exposure to certain chemicals or cleaning solutions, including soaps and detergents, perfumes and cosmetics, wool or synthetic fibers, dust, sand, and cigarette smoke ?Keeping your skin hydrated ?Emollients -- Emollients are creams and ointments that moisturize the skin and prevent it from drying out. The best emollients for people with eczema are thick creams (such as Eucerin, Cetaphil, and Nutraderm) or ointments (such as petroleum jelly, Aquaphor, and Vaseline), which contain little to no water. Emollients are most effective when applied immediately after bathing. Emollients can be applied twice a day or more often if needed. Lotions contain more water than creams and  ointments and are less effective for moisturizing the skin. ?Bathing -- It is not clear if showers or baths are better for keeping the skin hydrated. Lukewarm baths or showers can hydrate and cool the skin, temporarily relieving itching from eczema. An unscented, mild soap or non-soap cleanser (such as Cetaphil) should be used sparingly. Apply an emollient immediately after bathing or showering to prevent your skin from drying out as a result of water evaporation. Emollient bath additives (products you add to the bath water) have not been found to help relieve symptoms. ?Hot or long baths (more than 10 to 15 minutes) and showers should be avoided since they can dry out the skin. ? ?Based on what you shared with me you may have eczema.  ? ?I have prescribed: and Triamcinalone ointment (or cream). Apply to the effected areas twice per day. ? ?I recommend dilute bleach baths for people with eczema. These baths help to decrease the number of bacteria on the skin that can cause infections or worsen symptoms. To prepare a bleach bath, one-fourth to one-half cup of bleach is placed in a full bathtub (about 40 gallons) of water. Bleach baths are usually taken for 5 to 10 minutes twice per week and should be followed by application of an emollient (listed above). ?I recommend you take Benadryl 25mg - 50mg every 4 hours to control the symptoms (including itching) but if they last over 24 hours it is best that you see an office based provider for follow up. ? ?HOME CARE: ?Take lukewarm showers or baths ?Apply creams and ointments to prevent the skin from drying (Eucerin, Cetaphil, Nutraderm, petroleum jelly, Aquaphor or   Vaseline) - these products contain less water than other lotions and are more effective for moisturizing the skin ?Limit exposure to cold or dry environments, sweating, emotional stress and anxiety, rapid temperature changes and exposure to chemicals and cleaning products, soaps and detergents, perfumes,  cosmetics, wool and synthetic fibers, dust, sand and cigarette- factors which can aggravate eczema symptoms.  ?Use a hydrocortisone cream once or twice a day ?Take an antihistamine like Benadryl for widespread rashes that itch.  The adult dosage of Benadryl is 25-50 mg by mouth 4 times daily. ?Caution: This type of medication may cause sleepiness.  Do not drink alcohol, drive, or operate dangerous machinery while taking antihistamines.  Do not take these medications if you have prostate enlargement.  Read the package instructions thoroughly on all medications that you take.  ?GET HELP RIGHT AWAY IF: ?Symptoms that don't go away after treatment. ?Severe itching that persists. ?You develop a fever. ?Your skin begins to drain. ?You have a sore throat. ?You become short of breath. ? ?MAKE SURE YOU  ? ?Understand these instructions. ?Will watch your condition. ?Will get help right away if you are not doing well or get worse. ? ? ?Thank you for choosing an e-visit. ? ?Your e-visit answers were reviewed by a board certified advanced clinical practitioner to complete your personal care plan. Depending upon the condition, your plan could have included both over the counter or prescription medications. ? ?Please review your pharmacy choice. Make sure the pharmacy is open so you can pick up prescription now. If there is a problem, you may contact your provider through MyChart messaging and have the prescription routed to another pharmacy.  Your safety is important to us. If you have drug allergies check your prescription carefully.  ? ?For the next 24 hours you can use MyChart to ask questions about today's visit, request a non-urgent call back, or ask for a work or school excuse. ?You will get an email in the next two days asking about your experience. I hope that your e-visit has been valuable and will speed your recovery. ? ?I provided 5 minutes of non face-to-face time during this encounter for chart review and  documentation.  ? ?

## 2021-10-10 NOTE — Progress Notes (Signed)

## 2021-10-14 ENCOUNTER — Ambulatory Visit: Payer: 59 | Admitting: Nurse Practitioner

## 2021-10-23 ENCOUNTER — Telehealth: Payer: 59 | Admitting: Physician Assistant

## 2021-10-23 DIAGNOSIS — B001 Herpesviral vesicular dermatitis: Secondary | ICD-10-CM

## 2021-10-23 MED ORDER — VALACYCLOVIR HCL 1 G PO TABS: 2000.0000 mg | ORAL_TABLET | Freq: Two times a day (BID) | ORAL | 0 refills | Status: AC

## 2021-10-23 NOTE — Progress Notes (Signed)

## 2021-10-23 NOTE — Progress Notes (Signed)
I have spent 5 minutes in review of e-visit questionnaire, review and updating patient chart, medical decision making and response to patient.   Xavi Tomasik Cody Gerlene Glassburn, PA-C    

## 2021-10-29 ENCOUNTER — Ambulatory Visit: Payer: 59 | Admitting: Family Medicine

## 2021-10-29 VITALS — BP 112/70 | HR 87 | Temp 97.9°F | Ht 64.75 in | Wt 147.7 lb

## 2021-10-29 DIAGNOSIS — Z3A01 Less than 8 weeks gestation of pregnancy: Secondary | ICD-10-CM

## 2021-10-29 DIAGNOSIS — Z713 Dietary counseling and surveillance: Secondary | ICD-10-CM

## 2021-10-29 DIAGNOSIS — Z331 Pregnant state, incidental: Secondary | ICD-10-CM | POA: Diagnosis not present

## 2021-10-29 DIAGNOSIS — Z3201 Encounter for pregnancy test, result positive: Secondary | ICD-10-CM

## 2021-10-29 LAB — POCT URINE PREGNANCY: Preg Test, Ur: POSITIVE — AB

## 2021-10-29 NOTE — Patient Instructions (Signed)
Go ahead and start prenatal vitamin daily  I will set up referral to OB group.

## 2021-10-29 NOTE — Progress Notes (Signed)
Established Patient Office Visit  Subjective   Patient ID: Amanda Ortega, female    DOB: [birth year]  Age: 31 y.o. MRN: [id number]  Chief Complaint  Patient presents with   Possible Pregnancy    HPI   Patient is here basically requesting pregnancy test to confirm pregnancy.  She states she has done 7 home test which have all been positive.  Her last normal menstrual period was around 08-25-2021.  She denies any significant nausea or vomiting.  No vaginal spotting.  No vaginal discharge.  Her boyfriend is aware and is supportive.  She does plan to establish with OB soon.  She would like referral.  No recent fever.  No recent pelvic pain.  No prior history of pregnancy.  Currently not taking any regular medications.  Past Medical History:  Diagnosis Date   Anemia    Asthma    Past Surgical History:  Procedure Laterality Date   DENTAL SURGERY      reports that she has never smoked. She has never used smokeless tobacco. She reports current alcohol use. She reports that she does not use drugs. family history includes Colon cancer in her mother; Hypertension in her father and mother. Allergies  Allergen Reactions   Shellfish Allergy Swelling    Lip swelling    Naproxen Hives and Swelling    Tongue swelling    Review of Systems  Constitutional:  Negative for chills and fever.  Cardiovascular:  Negative for chest pain.  Genitourinary:  Negative for dysuria.      Objective:     BP 112/70 (BP Location: Left Arm, Patient Position: Sitting, Cuff Size: Normal)   Pulse 87   Temp 97.9 F (36.6 C) (Oral)   Ht 5' 4.75" (1.645 m)   Wt 147 lb 11.2 oz (67 kg)   LMP 09/30/2021 (Exact Date)   SpO2 99%   BMI 24.77 kg/m  BP Readings from Last 3 Encounters:  10/29/21 112/70  10/01/21 98/68  08/29/21 102/70   Wt Readings from Last 3 Encounters:  10/29/21 147 lb 11.2 oz (67 kg)  10/01/21 145 lb (65.8 kg)  08/29/21 141 lb 1.6 oz (64 kg)      Physical Exam Vitals reviewed.   Constitutional:      General: She is not in acute distress.    Appearance: She is not ill-appearing.  Cardiovascular:     Rate and Rhythm: Normal rate and regular rhythm.  Pulmonary:     Effort: Pulmonary effort is normal.     Breath sounds: Normal breath sounds.  Neurological:     Mental Status: She is alert.      Results for orders placed or performed in visit on 10/29/21  POCT urine pregnancy  Result Value Ref Range   Preg Test, Ur Positive (A) Negative      The ASCVD Risk score (Arnett DK, et al., 2019) failed to calculate for the following reasons:   The 2019 ASCVD risk score is only valid for ages 41 to 37    Assessment & Plan:   Problem List Items Addressed This Visit   None Visit Diagnoses     Less than [redacted] weeks gestation of pregnancy    -  Primary   Relevant Orders   POCT urine pregnancy (Completed)     Patient has positive pregnancy test here.  Doing well with no symptoms currently.  Last normal menstrual period 08-25-2021.  -Setting up OB referral to try to get established as soon as possible -We  did suggest she go ahead and start prenatal vitamin with folic acid -Avoid any over-the-counter or prescription medications unless she is sure these are safe in pregnancy.  She is currently not taking anything. -She knows to follow-up immediately for any vaginal bleeding, pelvic pain, or other concerns  No follow-ups on file.    Evelena Peat, MD

## 2021-11-05 ENCOUNTER — Ambulatory Visit: Payer: 59 | Admitting: Nurse Practitioner

## 2021-11-10 ENCOUNTER — Telehealth: Payer: 59 | Admitting: Family

## 2021-11-10 DIAGNOSIS — N898 Other specified noninflammatory disorders of vagina: Secondary | ICD-10-CM

## 2021-11-10 DIAGNOSIS — Z3490 Encounter for supervision of normal pregnancy, unspecified, unspecified trimester: Secondary | ICD-10-CM

## 2021-11-10 NOTE — Progress Notes (Signed)
Because you are pregnant and having vaginal discharge you need to be seen in person to have further testing.  I feel your condition warrants further evaluation and I recommend that you be seen in a face to face visit.   NOTE: There will be NO CHARGE for this eVisit   If you are having a true medical emergency please call 911.      For an urgent face to face visit, [place] has seven urgent care centers for your convenience:     [facility] Health Urgent Care Center at [facility] Directions [phone] [address] [place], [place] [address]    [facility] Health Urgent Care Center [facility]) Get Driving Directions [phone] [address] [place], [place] [address]  [facility] Health Urgent Care Center [facility] [employer]) Get Driving Directions [phone] [address] [place],  [place]  [address]  [facility] Health Urgent Care Center [facility] - at [employer] Directions  [phone] [address] [place],  [place] [address]   [facility] Health Urgent Care at [facility] Get Driving Directions [phone] [place], [place] [address]   [facility] Health Urgent Care at [facility] Get Driving Directions  [phone] [address] [place], [place] [address]   [facility] Health Urgent Care at [facility] Directions [phone] [address], [address] [place], [place] [address]  Your MyChart E-visit questionnaire answers were reviewed by a board certified advanced clinical practitioner to complete your personal care plan based on your specific symptoms.  Thank you for using e-Visits.

## 2021-11-10 NOTE — Progress Notes (Signed)
Sorry, you need to be seen in person for further treatment.   Jannifer Rodney, FNP

## 2021-11-11 ENCOUNTER — Encounter: Payer: Self-pay | Admitting: Physician Assistant

## 2021-11-11 ENCOUNTER — Other Ambulatory Visit: Payer: Self-pay | Admitting: Nurse Practitioner

## 2021-11-11 ENCOUNTER — Telehealth: Payer: 59 | Admitting: Physician Assistant

## 2021-11-11 ENCOUNTER — Encounter: Payer: Self-pay | Admitting: Family Medicine

## 2021-11-11 ENCOUNTER — Encounter: Payer: Self-pay | Admitting: Nurse Practitioner

## 2021-11-11 ENCOUNTER — Encounter: Payer: Self-pay | Admitting: Obstetrics and Gynecology

## 2021-11-11 DIAGNOSIS — O26891 Other specified pregnancy related conditions, first trimester: Secondary | ICD-10-CM

## 2021-11-11 DIAGNOSIS — N898 Other specified noninflammatory disorders of vagina: Secondary | ICD-10-CM | POA: Diagnosis not present

## 2021-11-11 DIAGNOSIS — N760 Acute vaginitis: Secondary | ICD-10-CM

## 2021-11-11 DIAGNOSIS — B9689 Other specified bacterial agents as the cause of diseases classified elsewhere: Secondary | ICD-10-CM

## 2021-11-11 MED ORDER — CLINDAMYCIN PHOSPHATE 2 % VA CREA: 1.0000 | TOPICAL_CREAM | Freq: Every day | VAGINAL | 0 refills | Status: AC

## 2021-11-11 MED ORDER — CLINDAMYCIN PHOSPHATE 2 % VA CREA: 1.0000 | TOPICAL_CREAM | Freq: Every day | VAGINAL | 0 refills | Status: DC

## 2021-11-11 MED ORDER — METRONIDAZOLE 0.75 % VA GEL: 1.0000 | Freq: Every day | VAGINAL | 0 refills | Status: DC

## 2021-11-11 NOTE — Progress Notes (Signed)
Virtual Visit Consent   Amanda Ortega, you are scheduled for a virtual visit with a [facility] provider today. Just as with appointments in the office, your consent must be obtained to participate. Your consent will be active for this visit and any virtual visit you may have with one of our providers in the next 365 days. If you have a MyChart account, a copy of this consent can be sent to you electronically.  As this is a virtual visit, video technology does not allow for your provider to perform a traditional examination. This may limit your provider's ability to fully assess your condition. If your provider identifies any concerns that need to be evaluated in person or the need to arrange testing (such as labs, EKG, etc.), we will make arrangements to do so. Although advances in technology are sophisticated, we cannot ensure that it will always work on either your end or our end. If the connection with a video visit is poor, the visit may have to be switched to a telephone visit. With either a video or telephone visit, we are not always able to ensure that we have a secure connection.  By engaging in this virtual visit, you consent to the provision of healthcare and authorize for your insurance to be billed (if applicable) for the services provided during this visit. Depending on your insurance coverage, you may receive a charge related to this service.  I need to obtain your verbal consent now. Are you willing to proceed with your visit today? Amanda Ortega has provided verbal consent on 11/11/2021 for a virtual visit (video or telephone). Margaretann Loveless, PA-C  Date: 11/11/2021 9:39 AM  Virtual Visit via Video Note   I, Margaretann Loveless, connected with  Amanda Ortega  ([id number], 1990-11-16) on 11/11/21 at  8:45 AM EDT by a video-enabled telemedicine application and verified that I am speaking with the correct person using two identifiers.  Location: Patient: Virtual Visit Location  Patient: Home Provider: Virtual Visit Location Provider: Home Office   I discussed the limitations of evaluation and management by telemedicine and the availability of in person appointments. The patient expressed understanding and agreed to proceed.    History of Present Illness: Amanda Ortega is a 31 y.o. who identifies as a female who was assigned female at birth, and is being seen today for vaginal itching.  HPI: Vaginal Itching The patient's primary symptoms include genital itching and vaginal discharge. The patient's pertinent negatives include no genital odor. This is a recurrent problem. The current episode started in the past 7 days. The problem occurs constantly. The problem has been gradually worsening. The patient is experiencing no pain. Associated symptoms include painful intercourse. Pertinent negatives include no abdominal pain, dysuria, flank pain, frequency or nausea. The vaginal discharge was white and thick. There has been no bleeding. She has not been passing clots. She has not been passing tissue. The symptoms are aggravated by intercourse. She has tried antifungals (monistat) for the symptoms. The treatment provided no relief. She is sexually active. No, her partner does not have an STD. Contraceptive use: She is pregnant, positive pregnancy on 10/29/21.      Problems: There are no problems to display for this patient.   Allergies:  Allergies  Allergen Reactions   Shellfish Allergy Swelling    Lip swelling    Naproxen Hives and Swelling    Tongue swelling   Medications:  Current Outpatient Medications:    albuterol (VENTOLIN HFA) 108 (90 Base)  MCG/ACT inhaler, SMARTSIG:2 Puff(s) By Mouth Every 6 Hours, Disp: , Rfl:    Ferrous Sulfate (IRON PO), Take by mouth., Disp: , Rfl:    fluticasone (FLONASE) 50 MCG/ACT nasal spray, Place 2 sprays into both nostrils daily., Disp: 16 g, Rfl: 0   Iron, Ferrous Sulfate, 325 (65 Fe) MG TABS, Take 325 mg by mouth 2 (two) times  daily., Disp: 60 tablet, Rfl: 2   metroNIDAZOLE (METROGEL) 0.75 % vaginal gel, Place 1 Applicatorful vaginally at bedtime for 5 days., Disp: 50 g, Rfl: 0   triamcinolone cream (KENALOG) 0.1 %, Apply 1 Application topically 2 (two) times daily., Disp: 45 g, Rfl: 0  Observations/Objective: Patient is well-developed, well-nourished in no acute distress.  Resting comfortably at home.  Head is normocephalic, atraumatic.  No labored breathing.  Speech is clear and coherent with logical content.  Patient is alert and oriented at baseline.    Assessment and Plan: 1. Vaginal discharge during pregnancy in first trimester  2. Vaginal itching  - Suspected recurrent vaginitis, but now pregnant - Advised to seek in person evaluation at PCP office or local urgent care for vaginal discharge in pregnancy; she agrees  Follow Up Instructions: I discussed the assessment and treatment plan with the patient. The patient was provided an opportunity to ask questions and all were answered. The patient agreed with the plan and demonstrated an understanding of the instructions.  A copy of instructions were sent to the patient via MyChart unless otherwise noted below.    The patient was advised to call back or seek an in-person evaluation if the symptoms worsen or if the condition fails to improve as anticipated.  Time:  I spent 18 minutes with the patient via telehealth technology discussing the above problems/concerns.    Margaretann Loveless, PA-C

## 2021-11-11 NOTE — Patient Instructions (Signed)
Julieta Musselman, thank you for joining Margaretann Loveless, PA-C for today's virtual visit.  While this provider is not your primary care provider (PCP), if your PCP is located in our provider database this encounter information will be shared with them immediately following your visit.  Consent: (Patient) Amanda Ortega provided verbal consent for this virtual visit at the beginning of the encounter.  Current Medications:  Current Outpatient Medications:    albuterol (VENTOLIN HFA) 108 (90 Base) MCG/ACT inhaler, SMARTSIG:2 Puff(s) By Mouth Every 6 Hours, Disp: , Rfl:    Ferrous Sulfate (IRON PO), Take by mouth., Disp: , Rfl:    fluticasone (FLONASE) 50 MCG/ACT nasal spray, Place 2 sprays into both nostrils daily., Disp: 16 g, Rfl: 0   Iron, Ferrous Sulfate, 325 (65 Fe) MG TABS, Take 325 mg by mouth 2 (two) times daily., Disp: 60 tablet, Rfl: 2   metroNIDAZOLE (METROGEL) 0.75 % vaginal gel, Place 1 Applicatorful vaginally at bedtime for 5 days., Disp: 50 g, Rfl: 0   triamcinolone cream (KENALOG) 0.1 %, Apply 1 Application topically 2 (two) times daily., Disp: 45 g, Rfl: 0   Medications ordered in this encounter:  No orders of the defined types were placed in this encounter.    *If you need refills on other medications prior to your next appointment, please contact your pharmacy*  Follow-Up: Call back or seek an in-person evaluation if the symptoms worsen or if the condition fails to improve as anticipated.  Other Instructions First Trimester of Pregnancy  The first trimester of pregnancy starts on the first day of your last menstrual period until the end of week 12. This is months 1 through 3 of pregnancy. A week after a sperm fertilizes an egg, the egg will implant into the wall of the uterus and begin to develop into a baby. By the end of 12 weeks, all the baby's organs will be formed and the baby will be 2-3 inches in size. Body changes during your first trimester Your body goes through  many changes during pregnancy. The changes vary and generally return to normal after your baby is born. Physical changes You may gain or lose weight. Your breasts may begin to grow larger and become tender. The tissue that surrounds your nipples (areola) may become darker. Dark spots or blotches (chloasma or mask of pregnancy) may develop on your face. You may have changes in your hair. These can include thickening or thinning of your hair or changes in texture. Health changes You may feel nauseous, and you may vomit. You may have heartburn. You may develop headaches. You may develop constipation. Your gums may bleed and may be sensitive to brushing and flossing. Other changes You may tire easily. You may urinate more often. Your menstrual periods will stop. You may have a loss of appetite. You may develop cravings for certain kinds of food. You may have changes in your emotions from day to day. You may have more vivid and strange dreams. Follow these instructions at home: Medicines Follow your health care provider's instructions regarding medicine use. Specific medicines may be either safe or unsafe to take during pregnancy. Do not take any medicines unless told to by your health care provider. Take a prenatal vitamin that contains at least 600 micrograms (mcg) of folic acid. Eating and drinking Eat a healthy diet that includes fresh fruits and vegetables, whole grains, good sources of protein such as meat, eggs, or tofu, and low-fat dairy products. Avoid raw meat and unpasteurized juice,  milk, and cheese. These carry germs that can harm you and your baby. If you feel nauseous or you vomit: Eat 4 or 5 small meals a day instead of 3 large meals. Try eating a few soda crackers. Drink liquids between meals instead of during meals. You may need to take these actions to prevent or treat constipation: Drink enough fluid to keep your urine pale yellow. Eat foods that are high in fiber,  such as beans, whole grains, and fresh fruits and vegetables. Limit foods that are high in fat and processed sugars, such as fried or sweet foods. Activity Exercise only as directed by your health care provider. Most people can continue their usual exercise routine during pregnancy. Try to exercise for 30 minutes at least 5 days a week. Stop exercising if you develop pain or cramping in the lower abdomen or lower back. Avoid exercising if it is very hot or humid or if you are at high altitude. Avoid heavy lifting. If you choose to, you may have sex unless your health care provider tells you not to. Relieving pain and discomfort Wear a good support bra to relieve breast tenderness. Rest with your legs elevated if you have leg cramps or low back pain. If you develop bulging veins (varicose veins) in your legs: Wear support hose as told by your health care provider. Elevate your feet for 15 minutes, 3-4 times a day. Limit salt in your diet. Safety Wear your seat belt at all times when driving or riding in a car. Talk with your health care provider if someone is verbally or physically abusive to you. Talk with your health care provider if you are feeling sad or have thoughts of hurting yourself. Lifestyle Do not use hot tubs, steam rooms, or saunas. Do not douche. Do not use tampons or scented sanitary pads. Do not use herbal remedies, alcohol, illegal drugs, or medicines that are not approved by your health care provider. Chemicals in these products can harm your baby. Do not use any products that contain nicotine or tobacco, such as cigarettes, e-cigarettes, and chewing tobacco. If you need help quitting, ask your health care provider. Avoid cat litter boxes and soil used by cats. These carry germs that can cause birth defects in the baby and possibly loss of the unborn baby (fetus) by miscarriage or stillbirth. General instructions During routine prenatal visits in the first trimester, your  health care provider will do a physical exam, perform necessary tests, and ask you how things are going. Keep all follow-up visits. This is important. Ask for help if you have counseling or nutritional needs during pregnancy. Your health care provider can offer advice or refer you to specialists for help with various needs. Schedule a dentist appointment. At home, brush your teeth with a soft toothbrush. Floss gently. Write down your questions. Take them to your prenatal visits. Where to find more information American Pregnancy Association: americanpregnancy.org [employer]necologists: https://www.todd-brady.net/ Office on [employer] Health: MightyReward.co.nz Contact a health care provider if you have: Dizziness. A fever. Mild pelvic cramps, pelvic pressure, or nagging pain in the abdominal area. Nausea, vomiting, or diarrhea that lasts for 24 hours or longer. A bad-smelling vaginal discharge. Pain when you urinate. Known exposure to a contagious illness, such as chickenpox, measles, Zika virus, HIV, or hepatitis. Get help right away if you have: Spotting or bleeding from your vagina. Severe abdominal cramping or pain. Shortness of breath or chest pain. Any kind of trauma, such as  from a fall or a car crash. New or increased pain, swelling, or redness in an arm or leg. Summary The first trimester of pregnancy starts on the first day of your last menstrual period until the end of week 12 (months 1 through 3). Eating 4 or 5 small meals a day rather than 3 large meals may help to relieve nausea and vomiting. Do not use any products that contain nicotine or tobacco, such as cigarettes, e-cigarettes, and chewing tobacco. If you need help quitting, ask your health care provider. Keep all follow-up visits. This is important. This information is not intended to replace advice given to you by your health care provider. Make sure you discuss any questions  you have with your health care provider. Document Revised: 08/17/2019 Document Reviewed: 06/23/2019 Elsevier Patient Education  2023 Elsevier Inc.    If you have been instructed to have an in-person evaluation today at a local Urgent Care facility, please use the link below. It will take you to a list of all of our available [facility], including address, phone number and hours of operation. Please do not delay care.  [facility]  If you or a family member do not have a primary care provider, use the link below to schedule a visit and establish care. When you choose a [facility] primary care physician or advanced practice provider, you gain a long-term partner in health. Find a Primary Care Provider  Learn more about [facility]'s in-office and virtual care options: [facility] - Get Care Now

## 2021-11-21 ENCOUNTER — Ambulatory Visit: Payer: 59 | Admitting: Nurse Practitioner

## 2021-12-02 IMAGING — DX DG FINGER THUMB 2+V*R*
3 series · 3 of 3 positions shown · non-contrast
Comparison: None.

CLINICAL DATA: Thumb pain for several weeks, initial encounter

EXAM:
RIGHT THUMB 2+V

[finger ap]
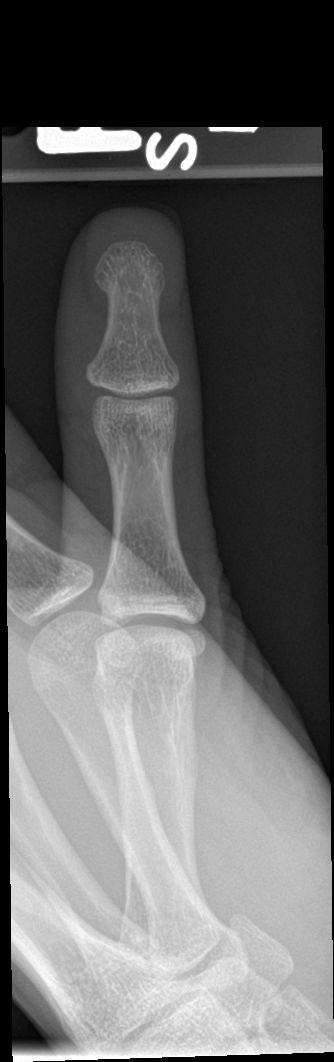

[finger obl]
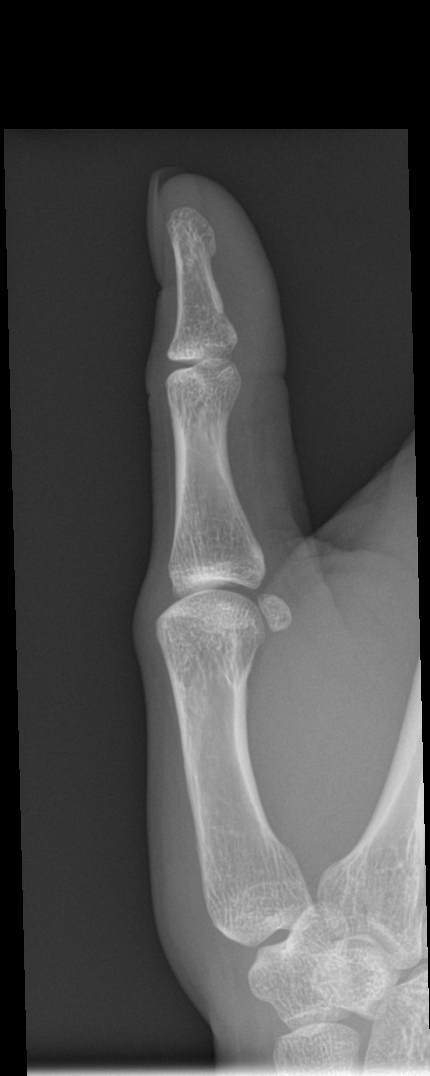

[finger lat]
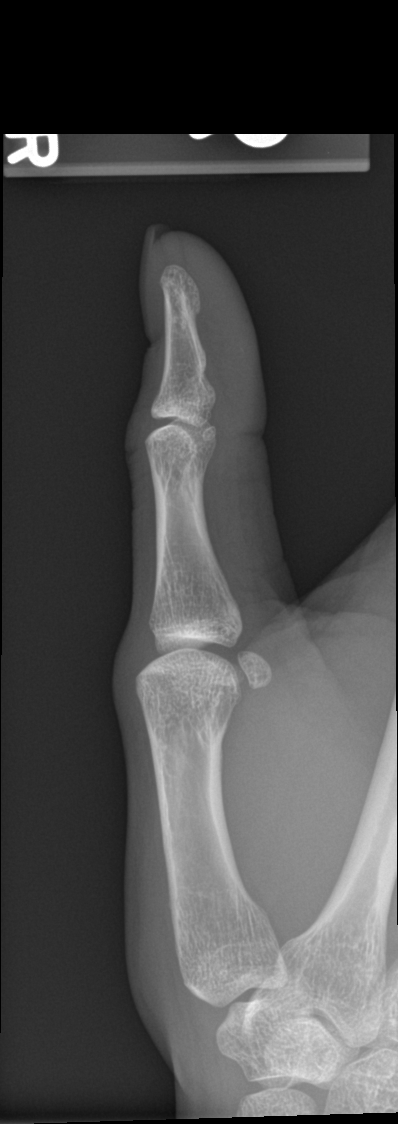

[3 of 3 positions shown; findings below may reference images not displayed]

FINDINGS: No acute fracture or dislocation is noted. No radiopaque foreign
body is seen. No soft tissue abnormality is noted.
IMPRESSION: No acute abnormality seen.

## 2021-12-05 ENCOUNTER — Ambulatory Visit (INDEPENDENT_AMBULATORY_CARE_PROVIDER_SITE_OTHER): Payer: 59

## 2021-12-05 VITALS — BP 98/63 | HR 84 | Ht 66.0 in | Wt 146.8 lb

## 2021-12-05 DIAGNOSIS — Z3401 Encounter for supervision of normal first pregnancy, first trimester: Secondary | ICD-10-CM | POA: Diagnosis not present

## 2021-12-05 DIAGNOSIS — O3680X0 Pregnancy with inconclusive fetal viability, not applicable or unspecified: Secondary | ICD-10-CM | POA: Diagnosis not present

## 2021-12-05 DIAGNOSIS — Z3403 Encounter for supervision of normal first pregnancy, third trimester: Secondary | ICD-10-CM

## 2021-12-05 HISTORY — DX: Encounter for supervision of normal first pregnancy, third trimester: Z34.03

## 2021-12-05 MED ORDER — GOJJI WEIGHT SCALE MISC: 1.0000 | 0 refills | Status: DC

## 2021-12-05 MED ORDER — BLOOD PRESSURE KIT DEVI: 1.0000 | 0 refills | Status: DC

## 2021-12-05 NOTE — Progress Notes (Cosign Needed)
New OB Intake  I connected with  Amanda Ortega on 12/05/21 at 10:15 AM EDT by in person and verified that I am speaking with the correct person using two identifiers. Nurse is located at [facility] and pt is located at [facility].  I discussed the limitations, risks, security and privacy concerns of performing an evaluation and management service by telephone and the availability of in person appointments. I also discussed with the patient that there may be a patient responsible charge related to this service. The patient expressed understanding and agreed to proceed.  I explained I am completing New OB Intake today. We discussed her EDD of 07/04/22 that is based on first trimester u/s. Pt is G1/P0. I reviewed her allergies, medications, Medical/Surgical/OB history, and appropriate screenings. I informed her of [facility] services. [facility] information placed in AVS. Based on history, this is a/an  pregnancy uncomplicated .   There are no problems to display for this patient.   Concerns addressed today  Delivery Plans Plans to deliver at [facility]. Patient given information for [facility] Healthy Baby website for more information [facility]. Patient is not interested in water birth. Offered upcoming OB visit with CNM to discuss further.  MyChart/Babyscripts MyChart access verified. I explained pt will have some visits in office and some virtually. Babyscripts instructions given and order placed. Patient verifies receipt of registration text/e-mail. Account successfully created and app downloaded.  Blood Pressure Cuff/Weight Scale Patient has private insurance; instructed to purchase blood pressure cuff and bring to first prenatal appt. Explained after first prenatal appt pt will check weekly and document in Babyscripts. Patient does not  have weight scale. Weight scale ordered for patient to pick up from [employer].   Anatomy US Explained first scheduled [place] will be around 19 weeks.  Dating and viability u/s performed today. Anatomy US ordered. Date TBD.  Labs Discussed Amanda Ortega genetic screening with patient. Would like both Panorama and Horizon drawn at new OB visit. Routine prenatal labs needed.  Covid Vaccine Patient has covid vaccine.   Is patient a CenteringPregnancy candidate?  Declined Declined due to Group Setting Not a candidate due to  Centering Patient" indicated on sticky note  Social Determinants of Health Food Insecurity: Patient denies food insecurity. WIC Referral: Patient is interested in referral to [facility].  Transportation: Patient denies transportation needs. Childcare: Discussed no children allowed at ultrasound appointments. Offered childcare services; patient declines childcare services at this time.  First visit review I reviewed new OB appt with pt. I explained she will have a provider visit that includes prenatal labs, std screening, genetic screening, pap smear, and discuss plan of care pregnancy. Explained pt will be seen by Amanda Ortega at first visit; encounter routed to appropriate provider. Explained that patient will be seen by pregnancy navigator following visit with provider.   Amanda Capri, RN 12/05/2021  10:21 AM

## 2021-12-09 ENCOUNTER — Encounter: Payer: Self-pay | Admitting: Obstetrics and Gynecology

## 2021-12-24 ENCOUNTER — Encounter: Payer: Self-pay | Admitting: Obstetrics and Gynecology

## 2021-12-26 ENCOUNTER — Encounter: Payer: 59 | Admitting: Obstetrics and Gynecology

## 2021-12-30 ENCOUNTER — Encounter: Payer: Self-pay | Admitting: Obstetrics and Gynecology

## 2021-12-30 ENCOUNTER — Other Ambulatory Visit (HOSPITAL_COMMUNITY)
Admission: RE | Admit: 2021-12-30 | Discharge: 2021-12-30 | Disposition: A | Discharge status: Home / Self Care | Payer: 59 | Source: Ambulatory Visit | Attending: Obstetrics and Gynecology | Admitting: Obstetrics and Gynecology

## 2021-12-30 ENCOUNTER — Ambulatory Visit (INDEPENDENT_AMBULATORY_CARE_PROVIDER_SITE_OTHER): Payer: 59 | Admitting: Licensed Clinical Social Worker

## 2021-12-30 ENCOUNTER — Ambulatory Visit (INDEPENDENT_AMBULATORY_CARE_PROVIDER_SITE_OTHER): Payer: 59 | Admitting: Obstetrics and Gynecology

## 2021-12-30 DIAGNOSIS — Z3482 Encounter for supervision of other normal pregnancy, second trimester: Secondary | ICD-10-CM

## 2021-12-30 DIAGNOSIS — Z3A13 13 weeks gestation of pregnancy: Secondary | ICD-10-CM | POA: Diagnosis not present

## 2021-12-30 DIAGNOSIS — Z3401 Encounter for supervision of normal first pregnancy, first trimester: Secondary | ICD-10-CM | POA: Diagnosis present

## 2021-12-30 DIAGNOSIS — Z3402 Encounter for supervision of normal first pregnancy, second trimester: Secondary | ICD-10-CM

## 2021-12-30 DIAGNOSIS — Z3481 Encounter for supervision of other normal pregnancy, first trimester: Secondary | ICD-10-CM

## 2021-12-30 NOTE — Progress Notes (Signed)
  Subjective:    Amanda Ortega is a G1P0000 [redacted]w[redacted]d being seen today for her first obstetrical visit.  Her obstetrical history is significant for first pregnancy. 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 Patient does intend to breast feed. Pregnancy history fully reviewed.  Patient reports nausea.  Vitals:   12/30/21 1514  BP: (!) 116/59  Pulse: 85  Weight: 149 lb 9.6 oz (67.9 kg)    HISTORY: OB History  Gravida Para Term Preterm AB Living  1 0 0 0 0 0  SAB IAB Ectopic Multiple Live Births  0 0 0 0 0    # Outcome Date GA Lbr Len/2nd Weight Sex Delivery Anes PTL Lv  1 Current            Past Medical History:  Diagnosis Date   Anemia    Asthma    Past Surgical History:  Procedure Laterality Date   DENTAL SURGERY     Family History  Problem Relation Age of Onset   Hypertension Mother    Colon cancer Mother    Hypertension Father    Hyperlipidemia Father    Asthma Maternal Grandmother      Exam    Uterus:   14-weeks  Pelvic Exam:    Perineum: No Hemorrhoids, Normal Perineum   Vulva: normal   Vagina:  normal mucosa, normal discharge   pH:    Cervix: nulliparous appearance and closed and long   Adnexa: normal adnexa and no mass, fullness, tenderness   Bony Pelvis: gynecoid  System: Breast:  normal appearance, no masses or tenderness   Skin: normal coloration and turgor, no rashes    Neurologic: oriented, no focal deficits   Extremities: normal strength, tone, and muscle mass   HEENT extra ocular movement intact   Mouth/Teeth mucous membranes moist, pharynx normal without lesions and dental hygiene good   Neck supple and no masses   Cardiovascular: regular rate and rhythm   Respiratory:  appears well, vitals normal, no respiratory distress, acyanotic, normal RR   Abdomen: soft, non-tender; bowel sounds normal; no masses,  no organomegaly   Urinary:       Assessment:    Pregnancy: G1P0000 Patient Active Problem List   Diagnosis Date Noted   Encounter for  supervision of normal first pregnancy in first trimester 12/05/2021        Plan:     Initial labs drawn. Prenatal vitamins. Problem list reviewed and updated. Genetic Screening discussed : panorama ordered.  Ultrasound discussed; fetal survey: ordered.  Follow up in 4 weeks. 50% of 30 min visit spent on counseling and coordination of care.     Amanda Ortega 12/30/2021

## 2021-12-30 NOTE — Patient Instructions (Signed)

## 2021-12-31 ENCOUNTER — Encounter: Payer: Self-pay | Admitting: Obstetrics and Gynecology

## 2021-12-31 LAB — HCV INTERPRETATION

## 2021-12-31 LAB — CERVICOVAGINAL ANCILLARY ONLY
Bacterial Vaginitis (gardnerella): NEGATIVE
Candida Glabrata: NEGATIVE
Candida Vaginitis: NEGATIVE
Chlamydia: NEGATIVE
Comment: NEGATIVE
Comment: NEGATIVE
Comment: NEGATIVE
Comment: NEGATIVE
Comment: NEGATIVE
Comment: NORMAL
Neisseria Gonorrhea: NEGATIVE
Trichomonas: NEGATIVE

## 2021-12-31 LAB — CBC/D/PLT+RPR+RH+ABO+RUBIGG...
Antibody Screen: NEGATIVE
Basophils Absolute: 0 10*3/uL (ref 0.0–0.2)
Basos: 1 %
EOS (ABSOLUTE): 0.5 10*3/uL — ABNORMAL HIGH (ref 0.0–0.4)
Eos: 7 %
HCV Ab: NONREACTIVE
HIV Screen 4th Generation wRfx: NONREACTIVE
Hematocrit: 35.9 % (ref 34.0–46.6)
Hemoglobin: 11.6 g/dL (ref 11.1–15.9)
Hepatitis B Surface Ag: NEGATIVE
Immature Grans (Abs): 0 10*3/uL (ref 0.0–0.1)
Immature Granulocytes: 1 %
Lymphocytes Absolute: 2.1 10*3/uL (ref 0.7–3.1)
Lymphs: 27 %
MCH: 30.1 pg (ref 26.6–33.0)
MCHC: 32.3 g/dL (ref 31.5–35.7)
MCV: 93 fL (ref 79–97)
Monocytes Absolute: 0.9 10*3/uL (ref 0.1–0.9)
Monocytes: 12 %
Neutrophils Absolute: 3.9 10*3/uL (ref 1.4–7.0)
Neutrophils: 52 %
Platelets: 196 10*3/uL (ref 150–450)
RBC: 3.85 x10E6/uL (ref 3.77–5.28)
RDW: 15.8 % — ABNORMAL HIGH (ref 11.7–15.4)
RPR Ser Ql: NONREACTIVE
Rh Factor: POSITIVE
Rubella Antibodies, IGG: 4.2 index (ref >0.99)
WBC: 7.5 10*3/uL (ref 3.4–10.8)

## 2022-01-01 LAB — CULTURE, OB URINE

## 2022-01-01 LAB — URINE CULTURE, OB REFLEX

## 2022-01-01 NOTE — BH Specialist Note (Signed)
Integrated Behavioral Health Initial In-Person Visit  MRN: [id number] Name: Amanda Ortega  Number of [place] Clinician visits: 1 Session Start time:   4:20pm Session End time: 4:52pm Total time in minutes: 32 mins in person at [facility]  Types of Service: [place] (BHI)  Interpretor:No. Interpretor Name and Language: none   Warm Hand Off Completed.        Subjective: [name] is a 31 y.o. female accompanied by n/a Patient was referred by Rosendo Gros MD  for New ob intro . Completed New ob intake with Ms. Chouinard and FOB. Ms. Emory express full understanding of practice offerings and services.    Lynnea Ferrier, LCSW

## 2022-01-05 LAB — PANORAMA PRENATAL TEST FULL PANEL:PANORAMA TEST PLUS 5 ADDITIONAL MICRODELETIONS: FETAL FRACTION: 9.2

## 2022-01-09 LAB — HORIZON CUSTOM: REPORT SUMMARY: NEGATIVE

## 2022-01-18 ENCOUNTER — Other Ambulatory Visit: Payer: Self-pay | Admitting: Internal Medicine

## 2022-01-18 DIAGNOSIS — L309 Dermatitis, unspecified: Secondary | ICD-10-CM

## 2022-01-20 MED ORDER — TRIAMCINOLONE ACETONIDE 0.1 % EX CREA: 1.0000 | TOPICAL_CREAM | Freq: Two times a day (BID) | CUTANEOUS | 0 refills | Status: DC

## 2022-01-23 ENCOUNTER — Encounter: Payer: Self-pay | Admitting: Obstetrics and Gynecology

## 2022-01-24 ENCOUNTER — Ambulatory Visit (INDEPENDENT_AMBULATORY_CARE_PROVIDER_SITE_OTHER): Payer: 59 | Admitting: Obstetrics

## 2022-01-24 ENCOUNTER — Encounter: Payer: Self-pay | Admitting: Obstetrics

## 2022-01-24 VITALS — BP 106/66 | HR 94 | Wt 149.8 lb

## 2022-01-24 DIAGNOSIS — Z3402 Encounter for supervision of normal first pregnancy, second trimester: Secondary | ICD-10-CM

## 2022-01-24 DIAGNOSIS — R300 Dysuria: Secondary | ICD-10-CM

## 2022-01-24 DIAGNOSIS — O26892 Other specified pregnancy related conditions, second trimester: Secondary | ICD-10-CM

## 2022-01-24 DIAGNOSIS — Z3A17 17 weeks gestation of pregnancy: Secondary | ICD-10-CM

## 2022-01-24 LAB — POCT URINALYSIS DIPSTICK
Bilirubin, UA: NEGATIVE
Glucose, UA: NEGATIVE
Ketones, UA: NEGATIVE
Nitrite, UA: NEGATIVE
Odor: NEGATIVE
Protein, UA: NEGATIVE
Spec Grav, UA: 1.01 (ref 1.010–1.025)
Urobilinogen, UA: 0.2 E.U./dL
pH, UA: 8.5 — AB (ref 5.0–8.0)

## 2022-01-24 MED ORDER — NITROFURANTOIN MONOHYD MACRO 100 MG PO CAPS: 100.0000 mg | ORAL_CAPSULE | Freq: Two times a day (BID) | ORAL | 2 refills | Status: DC

## 2022-01-24 NOTE — Progress Notes (Unsigned)
Pt reports mild pain when urinating and reports little urine coming out. Denies vaginal discharge and odor.

## 2022-01-24 NOTE — Progress Notes (Unsigned)
Subjective:  Amanda Ortega is a 31 y.o. G1P0000 at [redacted]w[redacted]d being seen today for ongoing prenatal care.  She is currently monitored for the following issues for this low-risk pregnancy and has Encounter for supervision of normal first pregnancy in first trimester on their problem list.  Patient reports nausea and urinary discomfort .  Contractions: Not present.  .   . Denies leaking of fluid.   The following portions of the patient's history were reviewed and updated as appropriate: allergies, current medications, past family history, past medical history, past social history, past surgical history and problem list. Problem list updated.  Objective:   Vitals:   01/24/22 1119  BP: 106/66  Pulse: 94  Weight: 149 lb 12.8 oz (67.9 kg)    Fetal Status: Fetal Heart Rate (bpm): 166         General:  Alert, oriented and cooperative. Patient is in no acute distress.  Skin: Skin is warm and dry. No rash noted.   Cardiovascular: Normal heart rate noted  Respiratory: Normal respiratory effort, no problems with respiration noted  Abdomen: Soft, gravid, appropriate for gestational age. Pain/Pressure: Absent     Pelvic:  Cervical exam deferred        Extremities: Normal range of motion.  Edema: None  Mental Status: Normal mood and affect. Normal behavior. Normal judgment and thought content.   Urinalysis:      Assessment and Plan:  Pregnancy: G1P0000 at [redacted]w[redacted]d  1. Encounter for supervision of normal first pregnancy in second trimester  2. Dysuria during pregnancy in second trimester Rx: - POCT Urinalysis Dipstick - Culture, OB Urine - nitrofurantoin, macrocrystal-monohydrate, (MACROBID) 100 MG capsule; Take 1 capsule (100 mg total) by mouth 2 (two) times daily. 1 po BID x 7days  Dispense: 14 capsule; Refill: 2    There are no diagnoses linked to this encounter. Preterm labor symptoms and general obstetric precautions including but not limited to vaginal bleeding, contractions, leaking of fluid  and fetal movement were reviewed in detail with the patient. Please refer to After Visit Summary for other counseling recommendations.   Return in about 4 weeks (around 02/21/2022) for ROB.   Shelly Bombard, MD 01/24/22

## 2022-01-26 LAB — URINE CULTURE, OB REFLEX

## 2022-01-26 LAB — CULTURE, OB URINE

## 2022-01-27 ENCOUNTER — Encounter: Payer: 59 | Admitting: Medical

## 2022-01-28 ENCOUNTER — Encounter: Payer: Self-pay | Admitting: Obstetrics and Gynecology

## 2022-01-28 ENCOUNTER — Encounter: Payer: 59 | Admitting: Obstetrics and Gynecology

## 2022-01-28 ENCOUNTER — Other Ambulatory Visit: Payer: Self-pay | Admitting: *Deleted

## 2022-01-28 DIAGNOSIS — O219 Vomiting of pregnancy, unspecified: Secondary | ICD-10-CM

## 2022-01-28 MED ORDER — PROMETHAZINE HCL 25 MG PO TABS: 25.0000 mg | ORAL_TABLET | Freq: Four times a day (QID) | ORAL | 1 refills | Status: DC | PRN

## 2022-02-02 ENCOUNTER — Other Ambulatory Visit: Payer: Self-pay | Admitting: Internal Medicine

## 2022-02-03 MED ORDER — ALBUTEROL SULFATE HFA 108 (90 BASE) MCG/ACT IN AERS: 2.0000 | INHALATION_SPRAY | RESPIRATORY_TRACT | 2 refills | Status: DC | PRN

## 2022-02-06 ENCOUNTER — Encounter: Payer: Self-pay | Admitting: Obstetrics

## 2022-02-06 ENCOUNTER — Encounter: Payer: Self-pay | Admitting: Internal Medicine

## 2022-02-07 ENCOUNTER — Encounter: Payer: Self-pay | Admitting: Obstetrics and Gynecology

## 2022-02-07 ENCOUNTER — Ambulatory Visit: Payer: 59

## 2022-02-07 ENCOUNTER — Ambulatory Visit: Payer: 59 | Attending: Obstetrics and Gynecology

## 2022-02-07 ENCOUNTER — Other Ambulatory Visit: Payer: Self-pay | Admitting: Obstetrics and Gynecology

## 2022-02-07 DIAGNOSIS — Z3401 Encounter for supervision of normal first pregnancy, first trimester: Secondary | ICD-10-CM | POA: Insufficient documentation

## 2022-02-07 DIAGNOSIS — Z363 Encounter for antenatal screening for malformations: Secondary | ICD-10-CM | POA: Insufficient documentation

## 2022-02-07 DIAGNOSIS — O358XX0 Maternal care for other (suspected) fetal abnormality and damage, not applicable or unspecified: Secondary | ICD-10-CM | POA: Diagnosis present

## 2022-02-07 DIAGNOSIS — O321XX0 Maternal care for breech presentation, not applicable or unspecified: Secondary | ICD-10-CM | POA: Insufficient documentation

## 2022-02-07 DIAGNOSIS — Z3482 Encounter for supervision of other normal pregnancy, second trimester: Secondary | ICD-10-CM

## 2022-02-07 DIAGNOSIS — O4442 Low lying placenta NOS or without hemorrhage, second trimester: Secondary | ICD-10-CM | POA: Insufficient documentation

## 2022-02-07 DIAGNOSIS — O4440 Low lying placenta NOS or without hemorrhage, unspecified trimester: Secondary | ICD-10-CM | POA: Insufficient documentation

## 2022-02-07 DIAGNOSIS — Z3A19 19 weeks gestation of pregnancy: Secondary | ICD-10-CM | POA: Insufficient documentation

## 2022-02-07 DIAGNOSIS — Z3402 Encounter for supervision of normal first pregnancy, second trimester: Secondary | ICD-10-CM

## 2022-02-10 ENCOUNTER — Other Ambulatory Visit: Payer: Self-pay | Admitting: *Deleted

## 2022-02-10 DIAGNOSIS — O4440 Low lying placenta NOS or without hemorrhage, unspecified trimester: Secondary | ICD-10-CM

## 2022-02-10 DIAGNOSIS — Z3689 Encounter for other specified antenatal screening: Secondary | ICD-10-CM

## 2022-02-13 ENCOUNTER — Encounter: Payer: Self-pay | Admitting: Family Medicine

## 2022-02-15 ENCOUNTER — Encounter: Payer: Self-pay | Admitting: Internal Medicine

## 2022-02-17 ENCOUNTER — Other Ambulatory Visit: Payer: 59

## 2022-02-17 ENCOUNTER — Ambulatory Visit: Payer: 59

## 2022-02-17 MED ORDER — ALBUTEROL SULFATE HFA 108 (90 BASE) MCG/ACT IN AERS: 2.0000 | INHALATION_SPRAY | RESPIRATORY_TRACT | 2 refills | Status: DC | PRN

## 2022-02-24 ENCOUNTER — Encounter: Payer: Self-pay | Admitting: Obstetrics and Gynecology

## 2022-02-24 ENCOUNTER — Ambulatory Visit (INDEPENDENT_AMBULATORY_CARE_PROVIDER_SITE_OTHER): Payer: 59 | Admitting: Obstetrics and Gynecology

## 2022-02-24 VITALS — BP 99/62 | HR 87 | Wt 154.0 lb

## 2022-02-24 DIAGNOSIS — O4442 Low lying placenta NOS or without hemorrhage, second trimester: Secondary | ICD-10-CM

## 2022-02-24 DIAGNOSIS — Z3401 Encounter for supervision of normal first pregnancy, first trimester: Secondary | ICD-10-CM

## 2022-02-24 DIAGNOSIS — Z3A21 21 weeks gestation of pregnancy: Secondary | ICD-10-CM

## 2022-02-24 DIAGNOSIS — O4440 Low lying placenta NOS or without hemorrhage, unspecified trimester: Secondary | ICD-10-CM

## 2022-02-24 DIAGNOSIS — Z3402 Encounter for supervision of normal first pregnancy, second trimester: Secondary | ICD-10-CM

## 2022-02-24 NOTE — Progress Notes (Signed)
Pt states she has increase in HA's. Would like to know what all to take.  Pt also complains of itching to abdomen. Pt does have hx of eczema.

## 2022-02-24 NOTE — Progress Notes (Signed)
   PRENATAL VISIT NOTE  Subjective:  Amanda Ortega is a 31 y.o. G1P0000 at [redacted]w[redacted]d being seen today for ongoing prenatal care.  She is currently monitored for the following issues for this low-risk pregnancy and has Encounter for supervision of normal first pregnancy in first trimester and Low lying placenta, antepartum on their problem list.  Patient reports no complaints.  Contractions: Not present.  .  Movement: Present. Denies leaking of fluid.   The following portions of the patient's history were reviewed and updated as appropriate: allergies, current medications, past family history, past medical history, past social history, past surgical history and problem list.   Objective:   Vitals:   02/24/22 1455  BP: 99/62  Pulse: 87  Weight: 154 lb (69.9 kg)    Fetal Status: Fetal Heart Rate (bpm): 166 Fundal Height: 22 cm Movement: Present     General:  Alert, oriented and cooperative. Patient is in no acute distress.  Skin: Skin is warm and dry. No rash noted.   Cardiovascular: Normal heart rate noted  Respiratory: Normal respiratory effort, no problems with respiration noted  Abdomen: Soft, gravid, appropriate for gestational age.  Pain/Pressure: Absent     Pelvic: Cervical exam deferred        Extremities: Normal range of motion.  Edema: None  Mental Status: Normal mood and affect. Normal behavior. Normal judgment and thought content.   Assessment and Plan:  Pregnancy: G1P0000 at [redacted]w[redacted]d 1. Encounter for supervision of normal first pregnancy in first trimester Patient is doing well without complaints AFP today  2. Low lying placenta, antepartum Follow up placenta location on 03/31/21 Bleeding precautions reviewed  Preterm labor symptoms and general obstetric precautions including but not limited to vaginal bleeding, contractions, leaking of fluid and fetal movement were reviewed in detail with the patient. Please refer to After Visit Summary for other counseling recommendations.    Return in about 4 weeks (around 03/24/2022) for in person, ROB, Low risk.  Future Appointments  Date Time Provider Department Center  03/31/2022  7:45 AM [facility] NURSE [facility]  03/31/2022  8:00 AM [facility] US1 [facility]    Catalina Antigua, MD

## 2022-02-26 LAB — AFP, SERUM, OPEN SPINA BIFIDA
AFP MoM: 0.71
AFP Value: 52.2 ng/mL
Gest. Age on Collection Date: 21.3 weeks
Maternal Age At EDD: 29.4 yr
OSBR Risk 1 IN: 10000
Test Results:: NEGATIVE
Weight: 154 [lb_av]

## 2022-03-12 ENCOUNTER — Other Ambulatory Visit (HOSPITAL_COMMUNITY)
Admission: RE | Admit: 2022-03-12 | Discharge: 2022-03-12 | Disposition: A | Discharge status: Home / Self Care | Payer: 59 | Source: Ambulatory Visit | Attending: Obstetrics & Gynecology | Admitting: Obstetrics & Gynecology

## 2022-03-12 ENCOUNTER — Ambulatory Visit (INDEPENDENT_AMBULATORY_CARE_PROVIDER_SITE_OTHER): Payer: 59 | Admitting: *Deleted

## 2022-03-12 VITALS — BP 98/65 | HR 80

## 2022-03-12 DIAGNOSIS — N898 Other specified noninflammatory disorders of vagina: Secondary | ICD-10-CM | POA: Diagnosis present

## 2022-03-12 DIAGNOSIS — O26892 Other specified pregnancy related conditions, second trimester: Secondary | ICD-10-CM | POA: Diagnosis present

## 2022-03-12 DIAGNOSIS — R0981 Nasal congestion: Secondary | ICD-10-CM

## 2022-03-12 MED ORDER — TERCONAZOLE 0.8 % VA CREA: 1.0000 | TOPICAL_CREAM | Freq: Every day | VAGINAL | 0 refills | Status: DC

## 2022-03-12 MED ORDER — FLUTICASONE PROPIONATE 50 MCG/ACT NA SUSP: 2.0000 | Freq: Every day | NASAL | 0 refills | Status: DC

## 2022-03-12 NOTE — Progress Notes (Signed)
SUBJECTIVE:  31 y.o. female complains of white vaginal discharge for 1 week(s). Reports external vaginal irritation.Denies abnormal vaginal bleeding or significant pelvic pain or fever. No UTI symptoms. Denies history of known exposure to STD.  Patient's last menstrual period was 08/25/2021.  OBJECTIVE:  She appears well, afebrile. Urine dipstick: not done.  ASSESSMENT:  Vaginal Discharge  Vaginal Odor   PLAN:  BVAG, CVAG probe sent to lab. Treatment: RX Terazole per protocol for signs/ symptoms of yeast infection in pregnancy. ROV prn if symptoms persist or worsen.  Pt requested refill flonase for nasal allergy symptoms. Dr. Debroah Loop consulted. RX refill approved and sent. Safe antihistamine and decongestant meds in pregnancy reviewed.

## 2022-03-13 LAB — CERVICOVAGINAL ANCILLARY ONLY
Bacterial Vaginitis (gardnerella): NEGATIVE
Candida Glabrata: NEGATIVE
Candida Vaginitis: POSITIVE — AB
Comment: NEGATIVE
Comment: NEGATIVE
Comment: NEGATIVE

## 2022-03-24 NOTE — L&D Delivery Note (Signed)
Delivery Note  Amanda Ortega is a 32 y.o. G1P0000 at [redacted]w[redacted]d presented for SROM/SOL.  Augmented with pitocin and progressed to second stage.  Pushed for over two hours.  At 6:34 PM a viable female was delivered via Vaginal, Spontaneous (Presentation: Right Occiput Posterior).  APGAR: 7,9.  Placenta status: Spontaneous, Intact.  Cord: 3 vessels with the following complications: None.   Anesthesia: Epidural Episiotomy: None Lacerations: None Est. Blood Loss (mL):  315 ml  Mom to postpartum.  Baby to Couplet care / Skin to Skin.  Jaynie Collins, MD 07/06/2022, 6:57 PM

## 2022-03-26 ENCOUNTER — Ambulatory Visit (INDEPENDENT_AMBULATORY_CARE_PROVIDER_SITE_OTHER): Payer: Commercial Managed Care - PPO | Admitting: Obstetrics and Gynecology

## 2022-03-26 ENCOUNTER — Other Ambulatory Visit (HOSPITAL_COMMUNITY)
Admission: RE | Admit: 2022-03-26 | Discharge: 2022-03-26 | Disposition: A | Discharge status: Home / Self Care | Payer: Commercial Managed Care - PPO | Source: Ambulatory Visit | Attending: Obstetrics and Gynecology | Admitting: Obstetrics and Gynecology

## 2022-03-26 VITALS — BP 98/61 | HR 85 | Wt 158.0 lb

## 2022-03-26 DIAGNOSIS — N898 Other specified noninflammatory disorders of vagina: Secondary | ICD-10-CM | POA: Insufficient documentation

## 2022-03-26 DIAGNOSIS — Z3401 Encounter for supervision of normal first pregnancy, first trimester: Secondary | ICD-10-CM

## 2022-03-26 DIAGNOSIS — Z3A25 25 weeks gestation of pregnancy: Secondary | ICD-10-CM

## 2022-03-26 DIAGNOSIS — O4442 Low lying placenta NOS or without hemorrhage, second trimester: Secondary | ICD-10-CM

## 2022-03-26 DIAGNOSIS — O4440 Low lying placenta NOS or without hemorrhage, unspecified trimester: Secondary | ICD-10-CM

## 2022-03-26 NOTE — Progress Notes (Signed)
ROB, c/o vaginal discharge, irritation, and diarrhea.

## 2022-03-26 NOTE — Progress Notes (Signed)
   PRENATAL VISIT NOTE  Subjective:  Amanda Ortega is a 32 y.o. G1P0000 at [redacted]w[redacted]d being seen today for ongoing prenatal care.  She is currently monitored for the following issues for this low-risk pregnancy and has Encounter for supervision of normal first pregnancy in first trimester and Low lying placenta, antepartum on their problem list.  Patient reports intermittent diarrhea x3d, nonbloody. No abdominal pain or fevers. Has had diarrhea like this before. Vaginal tingling - feels like yeast infection s/p terazol Contractions: Not present. Vag. Bleeding: None.  Movement: Present. Denies leaking of fluid.   The following portions of the patient's history were reviewed and updated as appropriate: allergies, current medications, past family history, past medical history, past social history, past surgical history and problem list.   Objective:   Vitals:   03/26/22 0844  BP: 98/61  Pulse: 85  Weight: 158 lb (71.7 kg)    Fetal Status: Fetal Heart Rate (bpm): 154   Movement: Present     General:  Alert, oriented and cooperative. Patient is in no acute distress.  Skin: Skin is warm and dry. No rash noted.   Cardiovascular: Normal heart rate noted  Respiratory: Normal respiratory effort, no problems with respiration noted  Abdomen: Soft, gravid, appropriate for gestational age.  Pain/Pressure: Absent      Assessment and Plan:  Pregnancy: G1P0000 at [redacted]w[redacted]d 1. Encounter for supervision of normal first pregnancy in first trimester Discussed Tdap, 2h GTT, and /3\ labs at next visit  2. Low lying placenta, antepartum Growth & reassessment of placental location scheduled for 1/8  3. Vaginal discharge Discussed general vulvar care including use of coconut oil and emollients for irritation. Will treat for yeast pending swab -     Cervicovaginal ancillary only( [place])   Preterm labor symptoms and general obstetric precautions including but not limited to vaginal bleeding, contractions,  leaking of fluid and fetal movement were reviewed in detail with the patient.  Please refer to After Visit Summary for other counseling recommendations.   Return in about 4 weeks (around 04/23/2022) for low risk return OB at 28-29 weeks - for tdap, 2hGTT, CBC, RPR, HIV.  Future Appointments  Date Time Provider [place]  03/31/2022  7:45 AM [facility] NURSE [facility]  03/31/2022  8:00 AM [facility] US1 [facility]  05/01/2022  8:00 AM [facility] LAB [facility] None  05/01/2022  8:35 AM Griffin Basil, MD [facility] None    Inez Catalina, MD

## 2022-03-27 ENCOUNTER — Other Ambulatory Visit: Payer: Self-pay

## 2022-03-27 ENCOUNTER — Encounter: Payer: Self-pay | Admitting: Obstetrics and Gynecology

## 2022-03-27 DIAGNOSIS — B379 Candidiasis, unspecified: Secondary | ICD-10-CM

## 2022-03-27 LAB — CERVICOVAGINAL ANCILLARY ONLY
Bacterial Vaginitis (gardnerella): NEGATIVE
Candida Glabrata: NEGATIVE
Candida Vaginitis: POSITIVE — AB
Chlamydia: NEGATIVE
Comment: NEGATIVE
Comment: NEGATIVE
Comment: NEGATIVE
Comment: NEGATIVE
Comment: NEGATIVE
Comment: NORMAL
Neisseria Gonorrhea: NEGATIVE
Trichomonas: NEGATIVE

## 2022-03-27 MED ORDER — TERCONAZOLE 0.4 % VA CREA: 1.0000 | TOPICAL_CREAM | Freq: Every day | VAGINAL | 0 refills | Status: DC

## 2022-03-31 ENCOUNTER — Ambulatory Visit: Payer: Commercial Managed Care - PPO | Attending: Obstetrics and Gynecology

## 2022-03-31 ENCOUNTER — Ambulatory Visit: Payer: Commercial Managed Care - PPO | Admitting: *Deleted

## 2022-03-31 VITALS — BP 112/55 | HR 87

## 2022-03-31 DIAGNOSIS — O4440 Low lying placenta NOS or without hemorrhage, unspecified trimester: Secondary | ICD-10-CM | POA: Diagnosis not present

## 2022-03-31 DIAGNOSIS — Z3A26 26 weeks gestation of pregnancy: Secondary | ICD-10-CM

## 2022-03-31 DIAGNOSIS — Z3689 Encounter for other specified antenatal screening: Secondary | ICD-10-CM | POA: Insufficient documentation

## 2022-03-31 DIAGNOSIS — O35BXX0 Maternal care for other (suspected) fetal abnormality and damage, fetal cardiac anomalies, not applicable or unspecified: Secondary | ICD-10-CM | POA: Diagnosis not present

## 2022-03-31 DIAGNOSIS — O4442 Low lying placenta NOS or without hemorrhage, second trimester: Secondary | ICD-10-CM | POA: Diagnosis not present

## 2022-04-23 DIAGNOSIS — Z3483 Encounter for supervision of other normal pregnancy, third trimester: Secondary | ICD-10-CM | POA: Diagnosis not present

## 2022-04-23 DIAGNOSIS — Z3482 Encounter for supervision of other normal pregnancy, second trimester: Secondary | ICD-10-CM | POA: Diagnosis not present

## 2022-05-01 ENCOUNTER — Other Ambulatory Visit: Payer: Commercial Managed Care - PPO

## 2022-05-01 ENCOUNTER — Ambulatory Visit (INDEPENDENT_AMBULATORY_CARE_PROVIDER_SITE_OTHER): Payer: Commercial Managed Care - PPO | Admitting: Obstetrics and Gynecology

## 2022-05-01 VITALS — BP 93/60 | HR 87 | Wt 162.0 lb

## 2022-05-01 DIAGNOSIS — Z3403 Encounter for supervision of normal first pregnancy, third trimester: Secondary | ICD-10-CM | POA: Diagnosis not present

## 2022-05-01 DIAGNOSIS — Z3A30 30 weeks gestation of pregnancy: Secondary | ICD-10-CM

## 2022-05-01 NOTE — Progress Notes (Signed)
   PRENATAL VISIT NOTE  Subjective:  Amanda Ortega is a 32 y.o. G1P0000 at [redacted]w[redacted]d being seen today for ongoing prenatal care.  She is currently monitored for the following issues for this low-risk pregnancy and has Encounter for supervision of normal first pregnancy, third trimester and Low lying placenta, antepartum on their problem list.  Patient doing well with no acute concerns today. She reports no complaints.  Contractions: Not present.  .  Movement: Present. Denies leaking of fluid.   The following portions of the patient's history were reviewed and updated as appropriate: allergies, current medications, past family history, past medical history, past social history, past surgical history and problem list. Problem list updated.  Objective:   Vitals:   05/01/22 0840  BP: 93/60  Pulse: 87  Weight: 162 lb (73.5 kg)    Fetal Status: Fetal Heart Rate (bpm): 160 Fundal Height: 30 cm Movement: Present     General:  Alert, oriented and cooperative. Patient is in no acute distress.  Skin: Skin is warm and dry. No rash noted.   Cardiovascular: Normal heart rate noted  Respiratory: Normal respiratory effort, no problems with respiration noted  Abdomen: Soft, gravid, appropriate for gestational age.  Pain/Pressure: Present     Pelvic: Cervical exam deferred        Extremities: Normal range of motion.     Mental Status:  Normal mood and affect. Normal behavior. Normal judgment and thought content.   Assessment and Plan:  Pregnancy: G1P0000 at [redacted]w[redacted]d  1. [redacted] weeks gestation of pregnancy   2. Encounter for supervision of normal first pregnancy, third trimester Continue routine prenatal care Low lying placenta resolved on previous u/s  Preterm labor symptoms and general obstetric precautions including but not limited to vaginal bleeding, contractions, leaking of fluid and fetal movement were reviewed in detail with the patient.  Please refer to After Visit Summary for other counseling  recommendations.   Return in about 2 weeks (around 05/15/2022) for ROB, virtual.   Lynnda Shields, MD Faculty Attending [facility]

## 2022-05-02 LAB — CBC
Hematocrit: 35.7 % (ref 34.0–46.6)
Hemoglobin: 12 g/dL (ref 11.1–15.9)
MCH: 32.7 pg (ref 26.6–33.0)
MCHC: 33.6 g/dL (ref 31.5–35.7)
MCV: 97 fL (ref 79–97)
Platelets: 225 10*3/uL (ref 150–450)
RBC: 3.67 x10E6/uL — ABNORMAL LOW (ref 3.77–5.28)
RDW: 11.2 % — ABNORMAL LOW (ref 11.7–15.4)
WBC: 9 10*3/uL (ref 3.4–10.8)

## 2022-05-02 LAB — RPR: RPR Ser Ql: NONREACTIVE

## 2022-05-02 LAB — GLUCOSE TOLERANCE, 2 HOURS W/ 1HR
Glucose, 1 hour: 94 mg/dL (ref 70–179)
Glucose, 2 hour: 88 mg/dL (ref 70–152)
Glucose, Fasting: 69 mg/dL — ABNORMAL LOW (ref 70–91)

## 2022-05-02 LAB — HIV ANTIBODY (ROUTINE TESTING W REFLEX): HIV Screen 4th Generation wRfx: NONREACTIVE

## 2022-05-11 ENCOUNTER — Encounter: Payer: Self-pay | Admitting: Obstetrics and Gynecology

## 2022-05-14 ENCOUNTER — Encounter: Payer: Self-pay | Admitting: Emergency Medicine

## 2022-05-16 ENCOUNTER — Telehealth (INDEPENDENT_AMBULATORY_CARE_PROVIDER_SITE_OTHER): Payer: Commercial Managed Care - PPO | Admitting: Obstetrics

## 2022-05-16 ENCOUNTER — Encounter: Payer: Self-pay | Admitting: Obstetrics

## 2022-05-16 DIAGNOSIS — O4440 Low lying placenta NOS or without hemorrhage, unspecified trimester: Secondary | ICD-10-CM

## 2022-05-16 DIAGNOSIS — O4443 Low lying placenta NOS or without hemorrhage, third trimester: Secondary | ICD-10-CM

## 2022-05-16 DIAGNOSIS — Z3A33 33 weeks gestation of pregnancy: Secondary | ICD-10-CM

## 2022-05-16 DIAGNOSIS — Z3403 Encounter for supervision of normal first pregnancy, third trimester: Secondary | ICD-10-CM

## 2022-05-16 NOTE — Progress Notes (Addendum)
Subjective:  Amanda Ortega is a 32 y.o. G1P0000 at [redacted]w[redacted]d being seen today for ongoing prenatal care.  She is currently monitored for the following issues for this low-risk pregnancy and has Encounter for supervision of normal first pregnancy, third trimester and Low lying placenta, antepartum on their problem list.  Patient location home I connected with Amanda Ortega on 05/16/22 at  8:55 AM EST by MyChart Video Encounter and verified that I am speaking with the correct person using two identifiers. I discussed the limitations, risks, security and privacy concerns of performing an evaluation and management service virtually and the availability of in person appointments. I also discussed with the patient that there may be a patient responsible charge related to this service. The patient expressed understanding and agreed to proceed.  Patient reports heartburn.  Contractions: Not present. Vag. Bleeding: None.  Movement: Present. Denies leaking of fluid.   The following portions of the patient's history were reviewed and updated as appropriate: allergies, current medications, past family history, past medical history, past social history, past surgical history and problem list. Problem list updated.  Objective:  There were no vitals filed for this visit.  Fetal Status:     Movement: Present     General:  Alert, oriented and cooperative. Patient is in no acute distress.  Skin: Skin is warm and dry. No rash noted.   Cardiovascular: Normal heart rate noted  Respiratory: Normal respiratory effort, no problems with respiration noted  Abdomen: Soft, gravid, appropriate for gestational age. Pain/Pressure: Present     Pelvic:  Cervical exam deferred        Extremities: Normal range of motion.  Edema: None  Mental Status: Normal mood and affect. Normal behavior. Normal judgment and thought content.   Urinalysis:      Assessment and Plan:  Pregnancy: G1P0000 at [redacted]w[redacted]d  1. Encounter for supervision of  normal first pregnancy, third trimester  2. Low lying placenta, antepartum, RESOLVED - MAY DELIVER VAGINALLY   Preterm labor symptoms and general obstetric precautions including but not limited to vaginal bleeding, contractions, leaking of fluid and fetal movement were reviewed in detail with the patient. Please refer to After Visit Summary for other counseling recommendations.   Return in about 2 weeks (around 05/30/2022) for ROB.   Shelly Bombard, MD 05/16/22

## 2022-05-16 NOTE — Progress Notes (Signed)
Connected with patient via phone call. Confirmed I was talking to the correct person by using two patient identifiers. Pt states everything is going well and she has no concerns. Unable to check BP due to not having the machine.

## 2022-05-23 ENCOUNTER — Telehealth: Payer: Self-pay

## 2022-05-23 NOTE — Telephone Encounter (Signed)
[email or web address]

## 2022-05-29 ENCOUNTER — Encounter: Payer: Self-pay | Admitting: Obstetrics and Gynecology

## 2022-05-29 ENCOUNTER — Other Ambulatory Visit: Payer: Self-pay

## 2022-05-29 DIAGNOSIS — B379 Candidiasis, unspecified: Secondary | ICD-10-CM

## 2022-05-29 MED ORDER — TERCONAZOLE 0.4 % VA CREA: 1.0000 | TOPICAL_CREAM | Freq: Every day | VAGINAL | 0 refills | Status: DC

## 2022-05-30 ENCOUNTER — Other Ambulatory Visit (HOSPITAL_COMMUNITY)
Admission: RE | Admit: 2022-05-30 | Discharge: 2022-05-30 | Disposition: A | Discharge status: Home / Self Care | Payer: Commercial Managed Care - PPO | Source: Ambulatory Visit | Attending: Obstetrics & Gynecology | Admitting: Obstetrics & Gynecology

## 2022-05-30 ENCOUNTER — Ambulatory Visit (INDEPENDENT_AMBULATORY_CARE_PROVIDER_SITE_OTHER): Payer: Commercial Managed Care - PPO | Admitting: Obstetrics & Gynecology

## 2022-05-30 ENCOUNTER — Encounter: Payer: Self-pay | Admitting: Obstetrics & Gynecology

## 2022-05-30 VITALS — BP 114/74 | HR 88 | Wt 168.0 lb

## 2022-05-30 DIAGNOSIS — Z3403 Encounter for supervision of normal first pregnancy, third trimester: Secondary | ICD-10-CM

## 2022-05-30 DIAGNOSIS — Z3A35 35 weeks gestation of pregnancy: Secondary | ICD-10-CM

## 2022-05-30 DIAGNOSIS — Z3A00 Weeks of gestation of pregnancy not specified: Secondary | ICD-10-CM | POA: Insufficient documentation

## 2022-05-30 DIAGNOSIS — B3731 Acute candidiasis of vulva and vagina: Secondary | ICD-10-CM | POA: Diagnosis not present

## 2022-05-30 NOTE — Progress Notes (Signed)
Pt states increase in pelvic pressure.  Pt would like to discuss work concerns.

## 2022-05-30 NOTE — Progress Notes (Signed)
   PRENATAL VISIT NOTE  Subjective:  Amanda Ortega is a 32 y.o. G1P0000 at [redacted]w[redacted]d being seen today for ongoing prenatal care.  She is currently monitored for the following issues for this low-risk pregnancy and has Encounter for supervision of normal first pregnancy, third trimester on their problem list.  Patient reports vaginal irritation.  Contractions: Not present. Vag. Bleeding: None.  Movement: Present. Denies leaking of fluid.   The following portions of the patient's history were reviewed and updated as appropriate: allergies, current medications, past family history, past medical history, past social history, past surgical history and problem list.   Objective:   Vitals:   05/30/22 0840  BP: 114/74  Pulse: 88  Weight: 76.2 kg    Fetal Status: Fetal Heart Rate (bpm): 155   Movement: Present     General:  Alert, oriented and cooperative. Patient is in no acute distress.  Skin: Skin is warm and dry. No rash noted.   Cardiovascular: Normal heart rate noted  Respiratory: Normal respiratory effort, no problems with respiration noted  Abdomen: Soft, gravid, appropriate for gestational age.  Pain/Pressure: Present     Pelvic: Cervical exam deferred        Extremities: Normal range of motion.     Mental Status: Normal mood and affect. Normal behavior. Normal judgment and thought content.   Assessment and Plan:  Pregnancy: G1P0000 at [redacted]w[redacted]d 1. Encounter for supervision of normal first pregnancy, third trimester R/O BV, treated for yeast - Cervicovaginal ancillary only( [place])  Preterm labor symptoms and general obstetric precautions including but not limited to vaginal bleeding, contractions, leaking of fluid and fetal movement were reviewed in detail with the patient. Please refer to After Visit Summary for other counseling recommendations.   Return in about 1 week (around 06/06/2022).  Future Appointments  Date Time Provider [place]  06/10/2022  8:55 AM Inez Catalina, MD [place] None  06/17/2022  1:50 PM Inez Catalina, MD [place] None  06/23/2022  3:50 PM Inez Catalina, MD [place] None  07/01/2022  8:55 AM Inez Catalina, MD [place] None  07/08/2022  8:55 AM Inez Catalina, MD [facility] None    Emeterio Reeve, MD

## 2022-06-01 ENCOUNTER — Other Ambulatory Visit: Payer: Self-pay | Admitting: Obstetrics & Gynecology

## 2022-06-01 DIAGNOSIS — R0981 Nasal congestion: Secondary | ICD-10-CM

## 2022-06-02 LAB — CERVICOVAGINAL ANCILLARY ONLY
Bacterial Vaginitis (gardnerella): NEGATIVE
Candida Glabrata: NEGATIVE
Candida Vaginitis: POSITIVE — AB
Chlamydia: NEGATIVE
Comment: NEGATIVE
Comment: NEGATIVE
Comment: NEGATIVE
Comment: NEGATIVE
Comment: NEGATIVE
Comment: NORMAL
Neisseria Gonorrhea: NEGATIVE
Trichomonas: NEGATIVE

## 2022-06-10 ENCOUNTER — Ambulatory Visit (INDEPENDENT_AMBULATORY_CARE_PROVIDER_SITE_OTHER): Payer: Commercial Managed Care - PPO | Admitting: Obstetrics and Gynecology

## 2022-06-10 VITALS — BP 103/64 | HR 86 | Wt 173.0 lb

## 2022-06-10 DIAGNOSIS — Z3403 Encounter for supervision of normal first pregnancy, third trimester: Secondary | ICD-10-CM

## 2022-06-10 DIAGNOSIS — Z1339 Encounter for screening examination for other mental health and behavioral disorders: Secondary | ICD-10-CM

## 2022-06-10 DIAGNOSIS — Z3A36 36 weeks gestation of pregnancy: Secondary | ICD-10-CM

## 2022-06-10 DIAGNOSIS — Z23 Encounter for immunization: Secondary | ICD-10-CM | POA: Diagnosis not present

## 2022-06-10 NOTE — Patient Instructions (Addendum)
[email or web address] 

## 2022-06-10 NOTE — Progress Notes (Signed)
   PRENATAL VISIT NOTE  Subjective:  Amanda Ortega is a 32 y.o. G1P0000 at [redacted]w[redacted]d being seen today for ongoing prenatal care.  She is currently monitored for the following issues for this low-risk pregnancy and has Encounter for supervision of normal first pregnancy, third trimester on their problem list.  Patient reports  she is doing well .  Contractions: Not present. Vag. Bleeding: None.  Movement: Present. Denies leaking of fluid.   The following portions of the patient's history were reviewed and updated as appropriate: allergies, current medications, past family history, past medical history, past social history, past surgical history and problem list.   Objective:   Vitals:   06/10/22 0918  BP: 103/64  Pulse: 86  Weight: 173 lb (78.5 kg)    Fetal Status: Fetal Heart Rate (bpm): 154 Fundal Height: 35 cm Movement: Present  Presentation: Vertex  General:  Alert, oriented and cooperative. Patient is in no acute distress.  Skin: Skin is warm and dry. No rash noted.   Cardiovascular: Normal heart rate noted  Respiratory: Normal respiratory effort, no problems with respiration noted  Abdomen: Soft, gravid, appropriate for gestational age.  Pain/Pressure: Present      Assessment and Plan:  Pregnancy: G1P0000 at [redacted]w[redacted]d 1. 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   Return in about 1 week (around 06/17/2022).  Future Appointments  Date Time Provider [place]  06/17/2022  1:50 PM Inez Catalina, MD [place] None  06/23/2022  3:50 PM Inez Catalina, MD [place] None  07/01/2022  8:55 AM Inez Catalina, MD [facility] None  07/08/2022  8:55 AM Inez Catalina, MD [facility] None   Inez Catalina, MD

## 2022-06-14 LAB — CULTURE, BETA STREP (GROUP B ONLY): Strep Gp B Culture: NEGATIVE

## 2022-06-17 ENCOUNTER — Other Ambulatory Visit: Payer: Self-pay

## 2022-06-17 ENCOUNTER — Ambulatory Visit (INDEPENDENT_AMBULATORY_CARE_PROVIDER_SITE_OTHER): Payer: Commercial Managed Care - PPO | Admitting: Obstetrics and Gynecology

## 2022-06-17 ENCOUNTER — Ambulatory Visit (INDEPENDENT_AMBULATORY_CARE_PROVIDER_SITE_OTHER): Payer: Self-pay | Admitting: Pediatrics

## 2022-06-17 VITALS — BP 101/67 | HR 89 | Wt 173.0 lb

## 2022-06-17 DIAGNOSIS — B379 Candidiasis, unspecified: Secondary | ICD-10-CM

## 2022-06-17 DIAGNOSIS — Z7681 Expectant parent(s) prebirth pediatrician visit: Secondary | ICD-10-CM

## 2022-06-17 DIAGNOSIS — Z3403 Encounter for supervision of normal first pregnancy, third trimester: Secondary | ICD-10-CM

## 2022-06-17 DIAGNOSIS — Z3A37 37 weeks gestation of pregnancy: Secondary | ICD-10-CM

## 2022-06-17 MED ORDER — CLOTRIMAZOLE 1 % VA CREA: 1.0000 | TOPICAL_CREAM | Freq: Every day | VAGINAL | 1 refills | Status: AC

## 2022-06-17 MED ORDER — TERCONAZOLE 0.4 % VA CREA: 1.0000 | TOPICAL_CREAM | Freq: Every day | VAGINAL | 0 refills | Status: DC

## 2022-06-17 MED ORDER — FLUTICASONE PROPIONATE 50 MCG/ACT NA SUSP: 2.0000 | Freq: Every day | NASAL | 0 refills | Status: DC

## 2022-06-17 NOTE — Progress Notes (Signed)
ROB, c/o yeast, wants to be treated.

## 2022-06-17 NOTE — Progress Notes (Signed)
Prenatal counseling for impending newborn done-- Z76.81  

## 2022-06-17 NOTE — Patient Instructions (Addendum)
[email or web address]

## 2022-06-17 NOTE — Progress Notes (Signed)
   PRENATAL VISIT NOTE  Subjective:  Amanda Ortega is a 32 y.o. G1P0000 at [redacted]w[redacted]d being seen today for ongoing prenatal care.  She is currently monitored for the following issues for this low-risk pregnancy and has Encounter for supervision of normal first pregnancy, third trimester on their problem list.  Patient reports  doing well, ready for baby to come .  Contractions: Irritability. Vag. Bleeding: None.  Movement: Present. Denies leaking of fluid.   The following portions of the patient's history were reviewed and updated as appropriate: allergies, current medications, past family history, past medical history, past social history, past surgical history and problem list.   Objective:   Vitals:   06/17/22 1351  BP: 101/67  Pulse: 89  Weight: 173 lb (78.5 kg)   Fetal Status: Fetal Heart Rate (bpm): 147 Fundal Height: 36 cm Movement: Present     General:  Alert, oriented and cooperative. Patient is in no acute distress.  Skin: Skin is warm and dry. No rash noted.   Cardiovascular: Normal heart rate noted  Respiratory: Normal respiratory effort, no problems with respiration noted  Abdomen: Soft, gravid, appropriate for gestational age.  Pain/Pressure: Absent      Assessment and Plan:  Pregnancy: G1P0000 at [redacted]w[redacted]d 1. Encounter for supervision of normal first pregnancy, third trimester 2. [redacted] weeks gestation of pregnancy Has appt set up with [place] Reviewed GBS negative Term labor symptoms and general obstetric precautions including but not limited to vaginal bleeding, contractions, leaking of fluid and fetal movement were reviewed in detail with the patient.  3. Yeast infection Rx for lotrimin sent  Please refer to After Visit Summary for other counseling recommendations.   Return in about 1 week (around 06/24/2022).  Future Appointments  Date Time Provider [place]  06/23/2022  3:50 PM Inez Catalina, MD [facility] None  07/01/2022   8:55 AM Inez Catalina, MD [facility] None  07/08/2022  8:55 AM Inez Catalina, MD [facility] None   Inez Catalina, MD

## 2022-06-22 ENCOUNTER — Other Ambulatory Visit: Payer: Self-pay | Admitting: Internal Medicine

## 2022-06-22 DIAGNOSIS — L309 Dermatitis, unspecified: Secondary | ICD-10-CM

## 2022-06-23 ENCOUNTER — Ambulatory Visit (INDEPENDENT_AMBULATORY_CARE_PROVIDER_SITE_OTHER): Payer: Commercial Managed Care - PPO | Admitting: Obstetrics and Gynecology

## 2022-06-23 VITALS — BP 110/68 | HR 93 | Wt 177.0 lb

## 2022-06-23 DIAGNOSIS — Z3403 Encounter for supervision of normal first pregnancy, third trimester: Secondary | ICD-10-CM

## 2022-06-23 DIAGNOSIS — Z3A38 38 weeks gestation of pregnancy: Secondary | ICD-10-CM

## 2022-06-23 DIAGNOSIS — L309 Dermatitis, unspecified: Secondary | ICD-10-CM

## 2022-06-23 MED ORDER — TRIAMCINOLONE ACETONIDE 0.1 % EX CREA: 1.0000 | TOPICAL_CREAM | Freq: Two times a day (BID) | CUTANEOUS | 0 refills | Status: DC

## 2022-06-23 NOTE — Progress Notes (Signed)
   PRENATAL VISIT NOTE  Subjective:  Amanda Ortega is a 32 y.o. G1P0000 at [redacted]w[redacted]d being seen today for ongoing prenatal care.  She is currently monitored for the following issues for this low-risk pregnancy and has Encounter for supervision of normal first pregnancy, third trimester on their problem list.  Patient reports  epigastric/RUQ pain. Occurred at night while lying down. Had eaten pasta w/ tomato & cream sauce. Not worse w/ deep breaths or palpation of the area. Has since resolved .  Contractions: Not present. Vag. Bleeding: None.  Movement: Present. Denies leaking of fluid.   The following portions of the patient's history were reviewed and updated as appropriate: allergies, current medications, past family history, past medical history, past social history, past surgical history and problem list.   Objective:   Vitals:   06/23/22 1548  BP: 110/68  Pulse: 93  Weight: 177 lb (80.3 kg)   Fetal Status: Fetal Heart Rate (bpm): 160 Fundal Height: 37 cm Movement: Present     General:  Alert, oriented and cooperative. Patient is in no acute distress.  Skin: Skin is warm and dry. No rash noted.   Cardiovascular: Normal heart rate noted  Respiratory: Normal respiratory effort, no problems with respiration noted  Abdomen: Soft, gravid, appropriate for gestational age.  Pain/Pressure: Present     SCE cl/l/hi performed in presence of chaperone  Assessment and Plan:  Pregnancy: G1P0000 at [redacted]w[redacted]d 1. Encounter for supervision of normal first pregnancy, third trimester 2. [redacted] weeks gestation of pregnancy FH/FHT appropriate Pain c/w GERD or biliary colic. Normotensive today. discussed warning si/sx to present to MAU pdIOL ordered  3. Eczema, unspecified type - triamcinolone cream (KENALOG) 0.1 %; Apply 1 Application topically 2 (two) times daily.  Dispense: 45 g; Refill: 0  Return in about 1 week (around 06/30/2022) for return OB at 39 weeks.  Future Appointments  Date Time Provider  [place]  07/01/2022  8:55 AM Inez Catalina, MD [facility] None  07/08/2022  8:55 AM Inez Catalina, MD [facility] None   Inez Catalina, MD

## 2022-06-23 NOTE — Patient Instructions (Signed)
[email or web address] 

## 2022-06-23 NOTE — Progress Notes (Signed)
Pt would like cervical exam. Pt needs refill on Triamcinolone cream.

## 2022-06-24 MED ORDER — TRIAMCINOLONE ACETONIDE 0.1 % EX CREA: 1.0000 | TOPICAL_CREAM | Freq: Two times a day (BID) | CUTANEOUS | 0 refills | Status: DC

## 2022-06-25 ENCOUNTER — Other Ambulatory Visit: Payer: Self-pay | Admitting: Obstetrics

## 2022-07-01 ENCOUNTER — Ambulatory Visit (INDEPENDENT_AMBULATORY_CARE_PROVIDER_SITE_OTHER): Payer: Commercial Managed Care - PPO | Admitting: Obstetrics and Gynecology

## 2022-07-01 VITALS — BP 109/75 | HR 87 | Wt 174.0 lb

## 2022-07-01 DIAGNOSIS — Z3403 Encounter for supervision of normal first pregnancy, third trimester: Secondary | ICD-10-CM

## 2022-07-01 DIAGNOSIS — Z3A39 39 weeks gestation of pregnancy: Secondary | ICD-10-CM

## 2022-07-01 NOTE — Progress Notes (Signed)
   PRENATAL VISIT NOTE  Subjective:  Amanda Ortega is a 32 y.o. G1P0000 at [redacted]w[redacted]d being seen today for ongoing prenatal care.  She is currently monitored for the following issues for this low-risk pregnancy and has Encounter for supervision of normal first pregnancy, third trimester on their problem list.  Patient reports  more pressure and cramping .  Contractions: Irregular. Vag. Bleeding: None.  Movement: Present. Denies leaking of fluid.   The following portions of the patient's history were reviewed and updated as appropriate: allergies, current medications, past family history, past medical history, past social history, past surgical history and problem list.   Objective:   Vitals:   07/01/22 0904  BP: 109/75  Pulse: 87  Weight: 174 lb (78.9 kg)   Fetal Status: Fetal Heart Rate (bpm): 150 Fundal Height: 38 cm Movement: Present    SCE 1/30/-3 performed in presence of chaperone General:  Alert, oriented and cooperative. Patient is in no acute distress.  Skin: Skin is warm and dry. No rash noted.   Cardiovascular: Normal heart rate noted  Respiratory: Normal respiratory effort, no problems with respiration noted  Abdomen: Soft, gravid, appropriate for gestational age.  Pain/Pressure: Present      Assessment and Plan:  Pregnancy: G1P0000 at [redacted]w[redacted]d 1. Encounter for supervision of normal first pregnancy, third trimester 2. [redacted] weeks gestation of pregnancy pdIOL scheduled for 4/19 Discussed & performed membrane sweep w/ pt consent. Advised to anticipate some spotting & cramping. Reviewed MAU precautions for bleeding enough to soak a panty liner, LOF, DFM or labor.  Term labor symptoms and general obstetric precautions including but not limited to vaginal bleeding, contractions, leaking of fluid and fetal movement were reviewed in detail with the patient. Please refer to After Visit Summary for other counseling recommendations.   Return in about 1 week (around 07/08/2022) for return OB  at 40 weeks with NST.  Future Appointments  Date Time Provider Department Center  07/08/2022  8:55 AM Lennart Pall, MD [facility] None  07/11/2022  6:45 AM [facility] SCHED ROOM [facility] None   Lennart Pall, MD

## 2022-07-01 NOTE — Progress Notes (Signed)
Pt would like cervix check today. Pt is having more pressure and ctx.

## 2022-07-02 ENCOUNTER — Encounter (HOSPITAL_COMMUNITY): Payer: Self-pay

## 2022-07-02 ENCOUNTER — Telehealth (HOSPITAL_COMMUNITY): Payer: Self-pay | Admitting: *Deleted

## 2022-07-02 NOTE — Telephone Encounter (Signed)
Preadmission screen  

## 2022-07-03 ENCOUNTER — Telehealth (HOSPITAL_COMMUNITY): Payer: Self-pay | Admitting: *Deleted

## 2022-07-03 ENCOUNTER — Encounter (HOSPITAL_COMMUNITY): Payer: Self-pay

## 2022-07-03 NOTE — Telephone Encounter (Signed)
Preadmission screen  

## 2022-07-04 ENCOUNTER — Telehealth (HOSPITAL_COMMUNITY): Payer: Self-pay | Admitting: *Deleted

## 2022-07-04 ENCOUNTER — Encounter (HOSPITAL_COMMUNITY): Payer: Self-pay | Admitting: *Deleted

## 2022-07-04 ENCOUNTER — Encounter: Payer: Self-pay | Admitting: Obstetrics and Gynecology

## 2022-07-04 NOTE — Telephone Encounter (Signed)
Preadmission screen  

## 2022-07-06 ENCOUNTER — Encounter (HOSPITAL_COMMUNITY): Payer: Self-pay | Admitting: Obstetrics & Gynecology

## 2022-07-06 ENCOUNTER — Inpatient Hospital Stay (HOSPITAL_COMMUNITY): Payer: Commercial Managed Care - PPO | Admitting: Anesthesiology

## 2022-07-06 ENCOUNTER — Inpatient Hospital Stay (HOSPITAL_COMMUNITY)
Admission: AD | Admit: 2022-07-06 | Discharge: 2022-07-06 | Disposition: A | Discharge status: Home / Self Care | Payer: Commercial Managed Care - PPO | Source: Home / Self Care | Attending: Obstetrics & Gynecology | Admitting: Obstetrics & Gynecology

## 2022-07-06 ENCOUNTER — Other Ambulatory Visit: Payer: Self-pay

## 2022-07-06 ENCOUNTER — Encounter (HOSPITAL_COMMUNITY): Payer: Self-pay | Admitting: Obstetrics and Gynecology

## 2022-07-06 ENCOUNTER — Inpatient Hospital Stay (HOSPITAL_COMMUNITY)
Admission: AD | Admit: 2022-07-06 | Discharge: 2022-07-08 | DRG: 806 | Disposition: A | Discharge status: Home / Self Care | Payer: Commercial Managed Care - PPO | Attending: Obstetrics & Gynecology | Admitting: Obstetrics & Gynecology

## 2022-07-06 DIAGNOSIS — Z3A40 40 weeks gestation of pregnancy: Secondary | ICD-10-CM | POA: Diagnosis not present

## 2022-07-06 DIAGNOSIS — O4443 Low lying placenta NOS or without hemorrhage, third trimester: Secondary | ICD-10-CM | POA: Diagnosis present

## 2022-07-06 DIAGNOSIS — O471 False labor at or after 37 completed weeks of gestation: Secondary | ICD-10-CM | POA: Insufficient documentation

## 2022-07-06 DIAGNOSIS — Z20822 Contact with and (suspected) exposure to covid-19: Secondary | ICD-10-CM | POA: Diagnosis present

## 2022-07-06 DIAGNOSIS — O480 Post-term pregnancy: Principal | ICD-10-CM | POA: Diagnosis present

## 2022-07-06 DIAGNOSIS — Z3689 Encounter for other specified antenatal screening: Secondary | ICD-10-CM

## 2022-07-06 DIAGNOSIS — O4202 Full-term premature rupture of membranes, onset of labor within 24 hours of rupture: Secondary | ICD-10-CM | POA: Diagnosis not present

## 2022-07-06 DIAGNOSIS — O9902 Anemia complicating childbirth: Secondary | ICD-10-CM | POA: Diagnosis not present

## 2022-07-06 DIAGNOSIS — O479 False labor, unspecified: Secondary | ICD-10-CM

## 2022-07-06 DIAGNOSIS — Z3403 Encounter for supervision of normal first pregnancy, third trimester: Secondary | ICD-10-CM

## 2022-07-06 DIAGNOSIS — D649 Anemia, unspecified: Secondary | ICD-10-CM | POA: Diagnosis not present

## 2022-07-06 HISTORY — DX: Dermatitis, unspecified: L30.9

## 2022-07-06 LAB — CBC
HCT: 36.8 % (ref 36.0–46.0)
Hemoglobin: 12 g/dL (ref 12.0–15.0)
MCH: 32.4 pg (ref 26.0–34.0)
MCHC: 32.6 g/dL (ref 30.0–36.0)
MCV: 99.5 fL (ref 80.0–100.0)
Platelets: 213 10*3/uL (ref 150–400)
RBC: 3.7 MIL/uL — ABNORMAL LOW (ref 3.87–5.11)
RDW: 12.5 % (ref 11.5–15.5)
WBC: 9.5 10*3/uL (ref 4.0–10.5)
nRBC: 0 % (ref 0.0–0.2)

## 2022-07-06 LAB — POCT FERN TEST: POCT Fern Test: POSITIVE

## 2022-07-06 LAB — TYPE AND SCREEN
ABO/RH(D): O POS
Antibody Screen: NEGATIVE

## 2022-07-06 LAB — SARS CORONAVIRUS 2 BY RT PCR: SARS Coronavirus 2 by RT PCR: NEGATIVE

## 2022-07-06 LAB — RPR: RPR Ser Ql: NONREACTIVE

## 2022-07-06 MED ORDER — LACTATED RINGERS IV SOLN
500.0000 mL | INTRAVENOUS | Status: DC | PRN
  Administered 2022-07-06: 500 mL via INTRAVENOUS

## 2022-07-06 MED ORDER — TERBUTALINE SULFATE 1 MG/ML IJ SOLN: 0.2500 mg | Freq: Once | INTRAMUSCULAR | Status: DC | PRN

## 2022-07-06 MED ORDER — ONDANSETRON HCL 4 MG/2ML IJ SOLN
4.0000 mg | Freq: Four times a day (QID) | INTRAMUSCULAR | Status: DC | PRN
  Administered 2022-07-06: 4 mg via INTRAVENOUS
  Filled 2022-07-06: qty 2

## 2022-07-06 MED ORDER — OXYCODONE HCL 5 MG PO TABS: 10.0000 mg | ORAL_TABLET | ORAL | Status: DC | PRN

## 2022-07-06 MED ORDER — FENTANYL-BUPIVACAINE-NACL 0.5-0.125-0.9 MG/250ML-% EP SOLN
12.0000 mL/h | EPIDURAL | Status: DC | PRN
  Administered 2022-07-06: 12 mL/h via EPIDURAL

## 2022-07-06 MED ORDER — IBUPROFEN 100 MG/5ML PO SUSP
600.0000 mg | Freq: Four times a day (QID) | ORAL | Status: DC | PRN
  Filled 2022-07-06: qty 30

## 2022-07-06 MED ORDER — ONDANSETRON HCL 4 MG PO TABS: 4.0000 mg | ORAL_TABLET | ORAL | Status: DC | PRN

## 2022-07-06 MED ORDER — LACTATED RINGERS IV SOLN: INTRAVENOUS | Status: DC

## 2022-07-06 MED ORDER — FENTANYL-BUPIVACAINE-NACL 0.5-0.125-0.9 MG/250ML-% EP SOLN
EPIDURAL | Status: AC
  Filled 2022-07-06: qty 250

## 2022-07-06 MED ORDER — EPHEDRINE 5 MG/ML INJ: 10.0000 mg | INTRAVENOUS | Status: DC | PRN

## 2022-07-06 MED ORDER — METHYLERGONOVINE MALEATE 0.2 MG/ML IJ SOLN
INTRAMUSCULAR | Status: AC
  Filled 2022-07-06: qty 1

## 2022-07-06 MED ORDER — TETANUS-DIPHTH-ACELL PERTUSSIS 5-2.5-18.5 LF-MCG/0.5 IM SUSY: 0.5000 mL | PREFILLED_SYRINGE | Freq: Once | INTRAMUSCULAR | Status: DC

## 2022-07-06 MED ORDER — SOD CITRATE-CITRIC ACID 500-334 MG/5ML PO SOLN: 30.0000 mL | ORAL | Status: DC | PRN

## 2022-07-06 MED ORDER — ACETAMINOPHEN 500 MG PO TABS
1000.0000 mg | ORAL_TABLET | Freq: Four times a day (QID) | ORAL | Status: DC
  Administered 2022-07-06 – 2022-07-08 (×7): 1000 mg via ORAL
  Filled 2022-07-06 (×7): qty 2

## 2022-07-06 MED ORDER — DOCUSATE SODIUM 100 MG PO CAPS
100.0000 mg | ORAL_CAPSULE | Freq: Two times a day (BID) | ORAL | Status: DC
  Administered 2022-07-07 – 2022-07-08 (×3): 100 mg via ORAL
  Filled 2022-07-06 (×3): qty 1

## 2022-07-06 MED ORDER — MEASLES, MUMPS & RUBELLA VAC IJ SOLR: 0.5000 mL | Freq: Once | INTRAMUSCULAR | Status: DC

## 2022-07-06 MED ORDER — FENTANYL CITRATE (PF) 100 MCG/2ML IJ SOLN
50.0000 ug | INTRAMUSCULAR | Status: DC | PRN
  Administered 2022-07-06: 100 ug via INTRAVENOUS
  Filled 2022-07-06: qty 2

## 2022-07-06 MED ORDER — PRENATAL MULTIVITAMIN CH
1.0000 | ORAL_TABLET | Freq: Every day | ORAL | Status: DC
  Administered 2022-07-07 – 2022-07-08 (×2): 1 via ORAL
  Filled 2022-07-06 (×2): qty 1

## 2022-07-06 MED ORDER — DIBUCAINE (PERIANAL) 1 % EX OINT: 1.0000 | TOPICAL_OINTMENT | CUTANEOUS | Status: DC | PRN

## 2022-07-06 MED ORDER — LACTATED RINGERS IV SOLN: 500.0000 mL | Freq: Once | INTRAVENOUS | Status: DC

## 2022-07-06 MED ORDER — ONDANSETRON HCL 4 MG/2ML IJ SOLN: 4.0000 mg | INTRAMUSCULAR | Status: DC | PRN

## 2022-07-06 MED ORDER — MEDROXYPROGESTERONE ACETATE 150 MG/ML IM SUSP
150.0000 mg | INTRAMUSCULAR | Status: AC | PRN
  Administered 2022-07-08: 150 mg via INTRAMUSCULAR
  Filled 2022-07-06: qty 1

## 2022-07-06 MED ORDER — ALBUTEROL SULFATE (2.5 MG/3ML) 0.083% IN NEBU: 3.0000 mL | INHALATION_SOLUTION | RESPIRATORY_TRACT | Status: DC | PRN

## 2022-07-06 MED ORDER — TRANEXAMIC ACID-NACL 1000-0.7 MG/100ML-% IV SOLN
INTRAVENOUS | Status: AC
  Filled 2022-07-06: qty 100

## 2022-07-06 MED ORDER — TRANEXAMIC ACID-NACL 1000-0.7 MG/100ML-% IV SOLN
1000.0000 mg | INTRAVENOUS | Status: AC
  Administered 2022-07-06: 1000 mg via INTRAVENOUS

## 2022-07-06 MED ORDER — DIPHENHYDRAMINE HCL 50 MG/ML IJ SOLN: 12.5000 mg | INTRAMUSCULAR | Status: DC | PRN

## 2022-07-06 MED ORDER — OXYCODONE HCL 5 MG PO TABS: 5.0000 mg | ORAL_TABLET | ORAL | Status: DC | PRN

## 2022-07-06 MED ORDER — PHENYLEPHRINE 80 MCG/ML (10ML) SYRINGE FOR IV PUSH (FOR BLOOD PRESSURE SUPPORT): 80.0000 ug | PREFILLED_SYRINGE | INTRAVENOUS | Status: DC | PRN

## 2022-07-06 MED ORDER — OXYTOCIN-SODIUM CHLORIDE 30-0.9 UT/500ML-% IV SOLN
1.0000 m[IU]/min | INTRAVENOUS | Status: DC
  Administered 2022-07-06: 2 m[IU]/min via INTRAVENOUS
  Filled 2022-07-06: qty 500

## 2022-07-06 MED ORDER — LACTATED RINGERS IV SOLN
INTRAVENOUS | Status: DC
  Administered 2022-07-06: 1000 mL via INTRAVENOUS

## 2022-07-06 MED ORDER — SIMETHICONE 80 MG PO CHEW: 80.0000 mg | CHEWABLE_TABLET | ORAL | Status: DC | PRN

## 2022-07-06 MED ORDER — DIPHENHYDRAMINE HCL 25 MG PO CAPS: 25.0000 mg | ORAL_CAPSULE | Freq: Four times a day (QID) | ORAL | Status: DC | PRN

## 2022-07-06 MED ORDER — COCONUT OIL OIL: 1.0000 | TOPICAL_OIL | Status: DC | PRN

## 2022-07-06 MED ORDER — FLUTICASONE PROPIONATE 50 MCG/ACT NA SUSP
2.0000 | Freq: Every day | NASAL | Status: DC
  Administered 2022-07-06 – 2022-07-08 (×3): 2 via NASAL
  Filled 2022-07-06: qty 16

## 2022-07-06 MED ORDER — WITCH HAZEL-GLYCERIN EX PADS: 1.0000 | MEDICATED_PAD | CUTANEOUS | Status: DC | PRN

## 2022-07-06 MED ORDER — LIDOCAINE HCL (PF) 1 % IJ SOLN
INTRAMUSCULAR | Status: DC | PRN
  Administered 2022-07-06: 3 mL via EPIDURAL
  Administered 2022-07-06: 5 mL via EPIDURAL

## 2022-07-06 MED ORDER — ACETAMINOPHEN 325 MG PO TABS: 650.0000 mg | ORAL_TABLET | ORAL | Status: DC | PRN

## 2022-07-06 MED ORDER — SENNOSIDES-DOCUSATE SODIUM 8.6-50 MG PO TABS
2.0000 | ORAL_TABLET | ORAL | Status: DC
  Administered 2022-07-07 – 2022-07-08 (×2): 2 via ORAL
  Filled 2022-07-06 (×2): qty 2

## 2022-07-06 MED ORDER — BENZOCAINE-MENTHOL 20-0.5 % EX AERO
1.0000 | INHALATION_SPRAY | CUTANEOUS | Status: DC | PRN
  Administered 2022-07-07: 1 via TOPICAL
  Filled 2022-07-06: qty 56

## 2022-07-06 MED ORDER — ZOLPIDEM TARTRATE 5 MG PO TABS: 5.0000 mg | ORAL_TABLET | Freq: Every evening | ORAL | Status: DC | PRN

## 2022-07-06 MED ORDER — IBUPROFEN 600 MG PO TABS
600.0000 mg | ORAL_TABLET | Freq: Four times a day (QID) | ORAL | Status: DC | PRN
  Administered 2022-07-06 – 2022-07-08 (×4): 600 mg via ORAL
  Filled 2022-07-06 (×4): qty 1

## 2022-07-06 MED ORDER — LIDOCAINE HCL (PF) 1 % IJ SOLN: 30.0000 mL | INTRAMUSCULAR | Status: DC | PRN

## 2022-07-06 MED ORDER — OXYTOCIN-SODIUM CHLORIDE 30-0.9 UT/500ML-% IV SOLN
2.5000 [IU]/h | INTRAVENOUS | Status: DC
  Administered 2022-07-06: 2.5 [IU]/h via INTRAVENOUS

## 2022-07-06 MED ORDER — METHYLERGONOVINE MALEATE 0.2 MG/ML IJ SOLN
0.2000 mg | Freq: Once | INTRAMUSCULAR | Status: AC
  Administered 2022-07-06: 0.2 mg via INTRAMUSCULAR

## 2022-07-06 MED ORDER — OXYTOCIN BOLUS FROM INFUSION
333.0000 mL | Freq: Once | INTRAVENOUS | Status: AC
  Administered 2022-07-06: 333 mL via INTRAVENOUS

## 2022-07-06 NOTE — Progress Notes (Signed)
Labor Progress Note  Amanda Ortega is a 32 y.o. G1P0000 at [redacted]w[redacted]d presented for SROM/SOL  S: Patient has been pushing for about one hour  O:  BP 118/67   Pulse (!) 106   Temp 98.4 F (36.9 C) (Oral)   Resp 17   LMP 08/25/2021   SpO2 100%  EFM: 175 bpm/ mild to Moderate variability/ 10x10 accels/ Early decels Contractions q2-3 minutes  CVE: Dilation: 10 Dilation Complete Date: 07/06/22 Dilation Complete Time: 1535 Effacement (%): 100 Cervical Position: Middle Station: Plus 2 Presentation: Vertex Exam by:: Amanda Nash, RN   A&P: 32 y.o. G1P0000 [redacted]w[redacted]d  here for SROM/SOL as above  #Labor: Pushing well. Hopeful for vaginal delivery soon. #Pain: Family/Friend support and Epidural #FWB: CAT 2 due to fetal tachycardia for the last about 15 minutes #GBS negative #COVID exposure: test pending. COVID precautions until test result.  Jaynie Collins, MD Faculty Practice [facility], [facility] for [facility] 07/06/22  5:34 PM

## 2022-07-06 NOTE — MAU Note (Signed)
Pt has returned to MAU after discharge reports a gush of fluid around 0245 am. Clear fluid. Contractions have now intensified 8/10. 3 cm

## 2022-07-06 NOTE — Discharge Summary (Shared)
Postpartum Discharge Summary  Date of Service updated***     Patient Name: Amanda Ortega DOB: 08-Mar-1991 MRN: [id number]  Date of admission: 07/06/2022 Delivery date:07/06/2022  Delivering provider: Jaynie Collins A  Date of discharge: 07/06/2022  Admitting diagnosis: Encounter for induction of labor [Z34.90] Intrauterine pregnancy: [redacted]w[redacted]d     Secondary diagnosis:  Principal Problem:   Vaginal delivery Active Problems:   Encounter for supervision of normal first pregnancy, third trimester  Additional problems: ***    Discharge diagnosis: Term Pregnancy Delivered                                              Post partum procedures:{Postpartum procedures:23558} Augmentation: Pitocin Complications: None  Hospital course: Onset of Labor With Vaginal Delivery      32 y.o. yo G1P0000 at [redacted]w[redacted]d was admitted in Latent Labor on 07/06/2022. Labor course was complicated by   Membrane Rupture Time/Date: 2:45 AM ,07/06/2022   Delivery Method:Vaginal, Spontaneous  Episiotomy: None  Lacerations:  None  Patient had a postpartum course complicated by none.  She is ambulating, tolerating a regular diet, passing flatus, and urinating well. Patient is discharged home in stable condition on 07/06/22.  Newborn Data: Birth date:07/06/2022  Birth time:6:34 PM  Gender:Female  Living status:Living  Apgars:7 ,9  Weight:   Magnesium Sulfate received: {Mag received:30440022} BMZ received: No Rhophylac:N/A MMR:N/A T-DaP:Given prenatally Flu: Yes Transfusion:{Transfusion received:30440034}  Physical exam  Vitals:   07/06/22 1721 07/06/22 1730 07/06/22 1800 07/06/22 1905  BP:  133/86 (!) 124/38 120/65  Pulse:  98 98 96  Resp:      Temp: 98.4 F (36.9 C)     TempSrc: Oral     SpO2:       General: {Exam; general:21111117} Lochia: {Desc; appropriate/inappropriate:30686::"appropriate"} Uterine Fundus: {Desc; firm/soft:30687} Incision: {Exam; incision:21111123} DVT Evaluation: {Exam;  dvt:2111122} Labs: Lab Results  Component Value Date   WBC 9.5 07/06/2022   HGB 12.0 07/06/2022   HCT 36.8 07/06/2022   MCV 99.5 07/06/2022   PLT 213 07/06/2022      Latest Ref Rng & Units 08/29/2021   11:42 AM  CMP  Glucose 70 - 99 mg/dL 72   BUN 6 - 23 mg/dL 12   Creatinine 4.00 - 1.20 mg/dL 8.67   Sodium 619 - 509 mEq/L 138   Potassium 3.5 - 5.1 mEq/L 3.3   Chloride 96 - 112 mEq/L 104   CO2 19 - 32 mEq/L 26   Calcium 8.4 - 10.5 mg/dL 9.6   Total Protein 6.0 - 8.3 g/dL 7.7   Total Bilirubin 0.2 - 1.2 mg/dL 0.4   Alkaline Phos 39 - 117 U/L 49   AST 0 - 37 U/L 22   ALT 0 - 35 U/L 16    Edinburgh Score:     No data to display           After visit meds:  Allergies as of 07/06/2022       Reactions   Shellfish Allergy Swelling   Lip swelling    Naproxen Hives, Swelling   Tongue swelling     Med Rec must be completed prior to using this [facility]***        Discharge home in stable condition Infant Feeding: {Baby feeding:23562} Infant Disposition:{CHL IP OB HOME WITH TOIZTI:45809} Discharge instruction: per After Visit Summary and Postpartum booklet. Activity: Advance as tolerated. Pelvic rest  for 6 weeks.  Diet: {OB ZOXW:96045409} Future Appointments: Future Appointments  Date Time Provider Department Center  07/08/2022  8:55 AM Lennart Pall, MD [facility] None  07/11/2022  6:45 AM [facility] SCHED ROOM [facility] None   Follow up Visit: Message sent to [facility] 4/14  Please schedule this patient for a In person postpartum visit in 6 weeks with the following provider: Any provider. Additional Postpartum F/U: n/a   Low risk pregnancy complicated by:  n/a Delivery mode:  Vaginal, Spontaneous  Anticipated Birth Control:  Depo   07/06/2022 Myrtie Hawk, DO

## 2022-07-06 NOTE — Anesthesia Procedure Notes (Signed)
Epidural Patient location during procedure: OB Start time: 07/06/2022 5:20 AM End time: 07/06/2022 5:25 AM  Staffing Anesthesiologist: Linton Rump, MD Performed: anesthesiologist   Preanesthetic Checklist Completed: patient identified, IV checked, site marked, risks and benefits discussed, surgical consent, monitors and equipment checked, pre-op evaluation and timeout performed  Epidural Patient position: sitting Prep: DuraPrep and site prepped and draped Patient monitoring: continuous pulse ox and blood pressure Approach: midline Location: L3-L4 Injection technique: LOR saline  Needle:  Needle type: Tuohy  Needle gauge: 17 G Needle length: 9 cm and 9 Needle insertion depth: 5.5 cm Catheter type: closed end flexible Catheter size: 19 Gauge Catheter at skin depth: 10 cm Test dose: negative  Assessment Events: blood not aspirated, no cerebrospinal fluid, injection not painful, no injection resistance, no paresthesia and negative IV test  Additional Notes The patient has requested an epidural for labor pain management. Risks and benefits including, but not limited to, infection, bleeding, local anesthetic toxicity, headache, hypotension, back pain, block failure, etc. were discussed with the patient. The patient expressed understanding and consented to the procedure. I confirmed that the patient has no bleeding disorders and is not taking blood thinners. I confirmed the patient's last platelet count with the nurse. A time-out was performed immediately prior to the procedure. Please see nursing documentation for vital signs. Sterile technique was used throughout the whole procedure. Once LOR achieved, the epidural catheter threaded easily without resistance. Aspiration of the catheter was negative for blood and CSF. The epidural was dosed slowly and an infusion was started.  2 attempt(s)Reason for block:procedure for pain

## 2022-07-06 NOTE — Anesthesia Preprocedure Evaluation (Addendum)
Anesthesia Evaluation  Patient identified by MRN, date of birth, ID band Patient awake    Reviewed: Allergy & Precautions, NPO status   History of Anesthesia Complications Negative for: history of anesthetic complications  Airway Mallampati: III  TM Distance: >3 FB Neck ROM: Full    Dental   Pulmonary asthma    Pulmonary exam normal breath sounds clear to auscultation       Cardiovascular negative cardio ROS  Rhythm:Regular Rate:Normal     Neuro/Psych negative neurological ROS     GI/Hepatic Neg liver ROS,GERD  ,,  Endo/Other  negative endocrine ROS    Renal/GU negative Renal ROS     Musculoskeletal   Abdominal   Peds  Hematology  (+) Blood dyscrasia, anemia   Anesthesia Other Findings   Reproductive/Obstetrics (+) Pregnancy                             Anesthesia Physical Anesthesia Plan  ASA: 2  Anesthesia Plan: Epidural   Post-op Pain Management:    Induction:   PONV Risk Score and Plan:   Airway Management Planned:   Additional Equipment:   Intra-op Plan:   Post-operative Plan:   Informed Consent: I have reviewed the patients History and Physical, chart, labs and discussed the procedure including the risks, benefits and alternatives for the proposed anesthesia with the patient or authorized representative who has indicated his/her understanding and acceptance.       Plan Discussed with: Anesthesiologist  Anesthesia Plan Comments: (I have discussed risks of neuraxial anesthesia including but not limited to infection, bleeding, nerve injury, back pain, headache, seizures, and failure of block. Patient denies bleeding disorders and is not currently anticoagulated. Labs have been reviewed. Risks and benefits discussed. All patient's questions answered.  )       Anesthesia Quick Evaluation

## 2022-07-06 NOTE — MAU Provider Note (Signed)
S: Ms. Darrianna Blonder is a 32 y.o. G1P0000 at [redacted]w[redacted]d  who presents to MAU today for labor evaluation.  Per nurse, patient requests discharge stating she wanted a cervical exam to make sure she was not in labor.   Cervical exam by RN:  Dilation: 1.5 Effacement (%): 70 Cervical Position: Posterior Station: -2 Presentation: Vertex Exam by:: Ginnie Smart RN  Fetal Monitoring: Baseline: 140 Variability: Moderate Accelerations: Present 15x15 Decelerations: None Contractions: Irritability  MDM Discussed patient with RN. NST reviewed.   A: SIUP at [redacted]w[redacted]d  False labor Cat I FT  P: NST Reactive Discharge home Labor precautions and kick counts included in AVS Patient to follow-up with primary office as scheduled  Patient may return to MAU as needed or when in labor   Gerrit Heck, CNM 07/06/2022 2:00 AM

## 2022-07-06 NOTE — Progress Notes (Signed)
Labor Progress Note  Amanda Ortega is a 32 y.o. G1P0000 at [redacted]w[redacted]d presented for SROM/SOL  S: notified of COVID exposure on Thursday 4/11  O:  BP 115/76   Pulse 83   Temp 97.8 F (36.6 C) (Oral)   Resp 17   LMP 08/25/2021   SpO2 100%  EFM: 135 bpm/ mild to Moderate variability/ 10x10 accels/ None decels  CVE: Dilation: 6 Effacement (%): 80 Cervical Position: Middle Station: 0 Presentation: Vertex Exam by:: Amado Nash, RN   A&P: 32 y.o. G1P0000 [redacted]w[redacted]d  here for SROM/SOL as above  #Labor: Progressing well. cont pitocin 2x2  #Pain: Family/Friend support and Epidural #FWB: CAT 1 #GBS negative #COVID exposure: test pending. COVID precautions until test result.  Myrtie Hawk, DO FMOB Fellow, Faculty practice [facility], Center for [facility] 07/06/22  2:13 PM

## 2022-07-06 NOTE — H&P (Signed)
OBSTETRIC ADMISSION HISTORY AND PHYSICAL  Amanda Ortega is a 32 y.o. female G1P0000 with IUP at [redacted]w[redacted]d by 9 week ultrasound presenting for SROM/SOL. She reports water broke at 0245, clear fluid. Contractions are every 2-3 minutes. She denies headache, blurry vision, or epigastric pain. Endorses active fetal movement. She plans on breast and bottle feeding. She request depo for birth control. She received her prenatal care at [facility]  Dating: By 9 week ultrasound --->  Estimated Date of Delivery: 07/04/22  Sono:    , CWD, normal anatomy, breech presentation, anterior low-lying placenta, 240g, 17% EFW  , CWD, normal anatomy, breech presentation, anterior placenta, 849g, 16% EFW  Prenatal History/Complications:  - Low-lying placenta (resolved)  Past Medical History: Past Medical History:  Diagnosis Date   Anemia    Asthma    Eczema     Past Surgical History: Past Surgical History:  Procedure Laterality Date   DENTAL SURGERY      Obstetrical History: OB History     Gravida  1   Para  0   Term  0   Preterm  0   AB  0   Living  0      SAB  0   IAB  0   Ectopic  0   Multiple  0   Live Births  0           Social History Social History   Socioeconomic History   Marital status: Single    Spouse name: Not on file   Number of children: Not on file   Years of education: Not on file   Highest education level: Bachelor's degree (e.g., BA, AB, BS)  Occupational History   Not on file  Tobacco Use   Smoking status: Never   Smokeless tobacco: Never  Vaping Use   Vaping Use: Never used  Substance and Sexual Activity   Alcohol use: Not Currently    Comment: social, not since confirmed pregnancy   Drug use: Never   Sexual activity: Yes    Partners: Male    Birth control/protection: None  Other Topics Concern   Not on file  Social History Narrative   Not on file   Social Determinants of Health   Financial Resource Strain: Low Risk   (10/29/2021)   Overall Financial Resource Strain (CARDIA)    Difficulty of Paying Living Expenses: Not very hard  Food Insecurity: No Food Insecurity (10/29/2021)   Hunger Vital Sign    Worried About Running Out of Food in the Last Year: Never true    Ran Out of Food in the Last Year: Never true  Transportation Needs: No Transportation Needs (10/29/2021)   PRAPARE - Administrator, Civil Service (Medical): No    Lack of Transportation (Non-Medical): No  Physical Activity: Insufficiently Active (10/29/2021)   Exercise Vital Sign    Days of Exercise per Week: 2 days    Minutes of Exercise per Session: 40 min  Stress: Patient Declined (10/29/2021)   [employer] of Occupational Health - Occupational Stress Questionnaire    Feeling of Stress : Patient declined  Social Connections: Moderately Isolated (10/29/2021)   Social Connection and Isolation Panel [NHANES]    Frequency of Communication with Friends and Family: More than three times a week    Frequency of Social Gatherings with Friends and Family: Patient declined    Attends Religious Services: More than 4 times per year    Active Member of Clubs or Organizations: No  Attends Banker Meetings: Not on file    Marital Status: Never married    Family History: Family History  Problem Relation Age of Onset   Hypertension Mother    Colon cancer Mother    Hypertension Father    Hyperlipidemia Father    Asthma Maternal Grandmother     Allergies: Allergies  Allergen Reactions   Shellfish Allergy Swelling    Lip swelling    Naproxen Hives and Swelling    Tongue swelling    Medications Prior to Admission  Medication Sig Dispense Refill Last Dose   albuterol (VENTOLIN HFA) 108 (90 Base) MCG/ACT inhaler Inhale 2 puffs into the lungs every 4 (four) hours as needed for wheezing or shortness of breath. 1 each 2    Blood Pressure Monitoring (BLOOD PRESSURE KIT) DEVI 1 kit by Does not apply route once a week. 1 each  0    fluticasone (FLONASE) 50 MCG/ACT nasal spray Place 2 sprays into both nostrils daily. 16 g 0    Iron, Ferrous Sulfate, 325 (65 Fe) MG TABS Take 325 mg by mouth 2 (two) times daily. 60 tablet 2    Misc. Devices (GOJJI WEIGHT SCALE) MISC 1 Device by Does not apply route every 30 (thirty) days. 1 each 0    Prenatal MV-Min-Fe Fum-FA-DHA ([employer] PRENATAL MULTI+DHA) 27-0.8-250 MG CAPS Take 1 capsule by mouth daily.      terconazole (TERAZOL 7) 0.4 % vaginal cream Place 1 applicator vaginally at bedtime. 45 g 0    triamcinolone cream (KENALOG) 0.1 % Apply 1 Application topically 2 (two) times daily. 45 g 0    triamcinolone cream (KENALOG) 0.1 % Apply 1 Application topically 2 (two) times daily. 45 g 0    Review of Systems   All systems reviewed and negative except as stated in HPI  Blood pressure 113/75, pulse 80, temperature 98 F (36.7 C), temperature source Oral, resp. rate 17, last menstrual period 08/25/2021, SpO2 100 %, unknown if currently breastfeeding. General appearance: alert and no distress Lungs: effort normal Heart: regular rate Abdomen: soft, non-tender; gravid Extremities: no sign of DVT Presentation: cephalic per RN exam Fetal monitoring 140 bpm, moderate variability, +10x10 accels, no decels Uterine activity Q 2-15mins  Dilation: 3 Effacement (%): 70 Station: -1 Exam by:: Kathi Simpers, RN  Prenatal labs: ABO, Rh: --/--/O POS (04/14 1610) Antibody: NEG (04/14 0339) Rubella: 4.20 (10/09 1641) RPR: Non Reactive (02/08 0813)  HBsAg: Negative (10/09 1641)  HIV: Non Reactive (02/08 0813)  GBS: Negative/-- (03/19 0948)  2 hr Glucola: 69/94/88 Genetic screening: LR NIPS Anatomy US: normal  Prenatal Transfer Tool  Maternal Diabetes: No Genetic Screening: Normal Maternal Ultrasounds/Referrals: Normal Fetal Ultrasounds or other Referrals:  None Maternal Substance Abuse:  No Significant Maternal Medications:  None Significant Maternal Lab Results: Group B Strep  negative  Results for orders placed or performed during the hospital encounter of 07/06/22 (from the past 24 hour(s))  POCT fern test   Collection Time: 07/06/22  3:00 AM  Result Value Ref Range   POCT Fern Test Positive = ruptured amniotic membanes   CBC   Collection Time: 07/06/22  3:39 AM  Result Value Ref Range   WBC 9.5 4.0 - 10.5 K/uL   RBC 3.70 (L) 3.87 - 5.11 MIL/uL   Hemoglobin 12.0 12.0 - 15.0 g/dL   HCT 96.0 45.4 - 09.8 %   MCV 99.5 80.0 - 100.0 fL   MCH 32.4 26.0 - 34.0 pg   MCHC 32.6 30.0 -  36.0 g/dL   RDW 81.2 75.1 - 70.0 %   Platelets 213 150 - 400 K/uL   nRBC 0.0 0.0 - 0.2 %  Type and screen   Collection Time: 07/06/22  3:39 AM  Result Value Ref Range   ABO/RH(D) O POS    Antibody Screen NEG    Sample Expiration      07/09/2022,2359 Performed at [facility], [address] [place], [place] [address]     Patient Active Problem List   Diagnosis Date Noted   Encounter for induction of labor 07/06/2022   Encounter for supervision of normal first pregnancy, third trimester 12/05/2021   Assessment/Plan:  Kitra Arzt is a 32 y.o. G1P0000 at [redacted]w[redacted]d here for SROM/SOL  #Labor: Augmentation with Pitocin. Titrate prn per unit policy #Pain: Epidural #FWB: Cat I #ID: GBS neg #MOF: Both #MOC: Depo #Circ: N/A   Brand Males, CNM  07/06/2022, 6:52 AM

## 2022-07-06 NOTE — MAU Note (Signed)
I have communicated with Gerrit Heck CNM at 0158 and reviewed vital signs:  Vitals:   07/06/22 0201 07/06/22 0215  BP:  113/68  Pulse:  90  Resp:  16  Temp:    SpO2: 100%     Vaginal exam:  Dilation: 1.5 Effacement (%): 70 Cervical Position: Posterior Station: -2 Presentation: Vertex Exam by:: Ginnie Smart RN,   Also reviewed contraction pattern and that non-stress test is reactive.  It has been documented that patient is contracting irregularly  with minimal(effacement only)cervical change from [facility] appointment on Tuesday 4/9  indicating  she is not in  labor. Pts membranes are intact and she is not having bleeding or bloody show. Patient denies any other complaints.  Based on this report provider has given order for discharge.  A discharge order and diagnosis entered by a provider.   Labor discharge instructions reviewed with patient.

## 2022-07-06 NOTE — Lactation Note (Addendum)
This note was copied from a baby's chart. Lactation Consultation Note  Patient Name: Amanda Ortega [id number] Date: 07/06/2022 Age:30 hours Reason for consult: 1st time breastfeeding;Initial assessment;Term;Infant < 6lbs  Per Birth Parent RN assisted with attempting latch infant at the breast and afterwards infant was given 15 mls of 22 kcal formula, results review, infant's blood glucose was 65 mg/ dl. Birth Parent will continue to BF infant by cues, on demand, 8+ times within 24 hours, Skin to skin. Birth Parent will continue to supplement infant after latch with any EBM 1st and then formula. Birth Parent was given handout , "Supplementing with Breastfeeding" Birth Parent knows if infant latches at the breast to offer Day 1 ( 5-7 mls ) of EBM/formula but if infant attempts or doesn't latch on Day 1 offer ( 10 mls) or more of EBM/formula. Birth Parent will continue to ask RN/ LC for further latch assistance. LC unable to assist with latch due infant recently being supplemented with 15 mls of formula at 2140 pm. LC discussed infant's input and output, Per Birth Parent, infant had one stool since birth. Birth Parent was  made aware of O/P services, breastfeeding support groups, community resources, and our phone # for post-discharge questions.    Birth Parent current feeding plan: 1- Continue to BF infant according to cues, on demand, 8+ times within 24 hours, STS. Continue to ask RN/LC for further latch assistance.  2- Afterwards will continue to supplement infant with any EBM 1st and then formula each feeding. 3- Continue to use DEBP every 3 hours for 15 minutes on initial setting.   Maternal Data Has patient been taught Hand Expression?: Yes Does the patient have breastfeeding experience prior to this delivery?: No  Feeding Mother's Current Feeding Choice: Breast Milk and Formula  LATCH Score  No latch assistance due infant recently being formula feed                 Lactation  Tools Discussed/Used Tools: Pump Breast pump type: Double-Electric Breast Pump Pump Education: Setup, frequency, and cleaning;Milk Storage Reason for Pumping: Infant less than 6 lbs at birth, not latching yet, to help stimulate and establish Birth Parent's milk supply, Birth Parent is supplementing formula. Pumping frequency: Birth Parent will continue to use DEBP every 3 hours for 15 minutes on inital setting. Pumped volume: 3 mL  Interventions Interventions: Breast feeding basics reviewed;Skin to skin;Hand express;LC Services brochure;Education  Discharge Pump: DEBP;Personal (Birth Parent already received her DEBP from her insurance, Birth Parent is [employer] Employee.)  Consult Status Consult Status: Follow-up Date: 07/07/22 Follow-up type: In-patient    Amanda Ortega 07/06/2022, 10:59 PM

## 2022-07-06 NOTE — MAU Note (Signed)
..  Amanda Ortega is a 32 y.o. at [redacted]w[redacted]d here in MAU reporting:   Contractions every: 5-10 minutes Onset of ctx: Today noon Pain score: 4/10  ROM: Intact Vaginal Bleeding: None Last SVE: .5  Epidural: Planning  Fetal Movement: Reports positive FM [id number] via External  Vitals:   07/06/22 0055  BP: 109/84  Pulse: 95  Resp: 16  Temp: 98.2 F (36.8 C)  SpO2: 98%       OB Office: Faculty GBS: Negative HSV: Denies hx of HSV Lab orders placed from triage: MAU Labor Eval

## 2022-07-06 NOTE — Progress Notes (Signed)
Labor Progress Note  Amanda Ortega is a 32 y.o. G1P0000 at [redacted]w[redacted]d presented for SROM/SOL  S: Pt doing well, not feeling contractions, has epidural  O:  BP (!) 95/54   Pulse 88   Temp 97.7 F (36.5 C) (Oral)   Resp 17   LMP 08/25/2021   SpO2 100%  EFM: 135 bpm/ mild to Moderate variability/ 10x10 accels/ None decels  CVE: Dilation: 3 Effacement (%): 80 Cervical Position: Middle Station: 0 Presentation: Vertex Exam by:: Amado Nash, RN   A&P: 32 y.o. G1P0000 [redacted]w[redacted]d  here for SROM/SOL as above  #Labor: Progressing well. Start pitocin 2x2  #Pain: Family/Friend support and Epidural #FWB: CAT 1 #GBS negative  Myrtie Hawk, DO FMOB Fellow, Faculty practice [facility], Center for [facility] 07/06/22  9:15 AM

## 2022-07-06 NOTE — Progress Notes (Signed)
Labor Progress Note  Amanda Ortega is a 32 y.o. G1P0000 at 105w2d presented for SROM/SOL  S: Pt feeling pressure, early decelerations noted  O:  BP 123/81   Pulse (!) 103   Temp 97.8 F (36.6 C) (Oral)   Resp 17   LMP 08/25/2021   SpO2 100%  EFM: 135 bpm/ mild to Moderate variability/ 10x10 accels/ Early decels  CVE: Dilation: 10 Dilation Complete Date: 07/06/22 Dilation Complete Time: 1535 Effacement (%): 100 Cervical Position: Middle Station: Plus 1 Presentation: Vertex Exam by:: Dr. Camelia Phenes   A&P: 32 y.o. G1P0000 [redacted]w[redacted]d  here for SROM/SOL as above  #Labor: Progressing well. Labor down in throne #Pain: Family/Friend support and Epidural #FWB: CAT 1 #GBS negative #COVID exposure: test pending. COVID precautions until test result.  Myrtie Hawk, DO FMOB Fellow, Faculty practice [facility], Center for [facility] 07/06/22  3:40 PM

## 2022-07-07 LAB — CBC
HCT: 31.4 % — ABNORMAL LOW (ref 36.0–46.0)
Hemoglobin: 10.4 g/dL — ABNORMAL LOW (ref 12.0–15.0)
MCH: 32.7 pg (ref 26.0–34.0)
MCHC: 33.1 g/dL (ref 30.0–36.0)
MCV: 98.7 fL (ref 80.0–100.0)
Platelets: 172 10*3/uL (ref 150–400)
RBC: 3.18 MIL/uL — ABNORMAL LOW (ref 3.87–5.11)
RDW: 12.7 % (ref 11.5–15.5)
WBC: 15.3 10*3/uL — ABNORMAL HIGH (ref 4.0–10.5)
nRBC: 0 % (ref 0.0–0.2)

## 2022-07-07 NOTE — Lactation Note (Signed)
This note was copied from a baby's chart. Lactation Consultation Note  Patient Name: Amanda Ortega [id number] Date: 07/07/2022 Age:30 hours Reason for consult: Mother's request;Infant < 6lbs;1st time breastfeeding  Birth Parent requested latch assistance, infant latched on Birth Parent's right breast using the football hold position, infant sustained latch and was still breastfeeding after 7 minutes. Birth Parent plans to offer infant the 2 mls of colostrum she pumped earlier and then offer 8 mls of formula. Then use DEBP again. Birth Parent will continue to BF infant by cues, on demand, 8 + times within 24 hours, then supplement infant with any EBM first and then formula . Birth Parent will continue to ask RN/ LC  for latch assistance if needed.   Maternal Data Has patient been taught Hand Expression?: Yes Does the patient have breastfeeding experience prior to this delivery?: No  Feeding Mother's Current Feeding Choice: Breast Milk and Formula  LATCH Score Latch: Grasps breast easily, tongue down, lips flanged, rhythmical sucking.  Audible Swallowing: A few with stimulation  Type of Nipple: Everted at rest and after stimulation  Comfort (Breast/Nipple): Soft / non-tender  Hold (Positioning): Assistance needed to correctly position infant at breast and maintain latch.  LATCH Score: 8   Lactation Tools Discussed/Used Tools: Pump Breast pump type: Double-Electric Breast Pump Pump Education: Setup, frequency, and cleaning;Milk Storage Reason for Pumping: Infant less than 6 lbs at birth, not latching yet, to help stimulate and establish Birth Parent's milk supply, Birth Parent is supplementing formula. Pumping frequency: Birth Parent will continue to use DEBP every 3 hours for 15 minutes on inital setting. Pumped volume: 3 mL  Interventions Interventions: Support pillows;Adjust position;Position options;Education;Skin to skin;Breast compression  Discharge Pump: DEBP;Personal  (Birth Parent already received her DEBP from her insurance, Birth Parent is [employer] Employee.)  Consult Status Consult Status: Follow-up Date: 07/07/22 Follow-up type: In-patient    [name] 07/07/2022, 1:07 AM

## 2022-07-07 NOTE — Anesthesia Postprocedure Evaluation (Signed)
Anesthesia Post Note  Patient: Amanda Ortega  Procedure(s) Performed: AN AD HOC LABOR EPIDURAL     Patient location during evaluation: Mother Baby Anesthesia Type: Epidural Level of consciousness: awake and alert Pain management: pain level controlled Vital Signs Assessment: post-procedure vital signs reviewed and stable Respiratory status: spontaneous breathing, nonlabored ventilation and respiratory function stable Cardiovascular status: stable Postop Assessment: no headache, no backache and epidural receding Anesthetic complications: no   No notable events documented.  Last Vitals:  Vitals:   07/07/22 0145 07/07/22 0614  BP: 123/80 99/62  Pulse: 82 82  Resp: 18 18  Temp: 37.1 C   SpO2: 100% 100%    Last Pain:  Vitals:   07/07/22 0614  TempSrc:   PainSc: 4    Pain Goal:                   Rilen Shukla

## 2022-07-07 NOTE — Progress Notes (Addendum)
POSTPARTUM PROGRESS NOTE  Post Partum Day 1  Subjective:  Amanda Ortega is a 32 y.o. G1P1001 s/p VD at [redacted]w[redacted]d.  She reports she is doing well. No acute events overnight. She denies any problems with ambulating, voiding or po intake. Denies nausea or vomiting.  Pain is well controlled. Has not passed flatus yet.  Lochia is appropriate.  Objective: Blood pressure 99/62, pulse 82, temperature 98.8 F (37.1 C), temperature source Oral, resp. rate 18, last menstrual period 08/25/2021, SpO2 100 %, unknown if currently breastfeeding.  Physical Exam:  General: alert, cooperative and no distress Chest: no respiratory distress Heart:regular rate, distal pulses intact Uterine Fundus: firm, appropriately tender DVT Evaluation: No calf swelling or tenderness Extremities: no significant edema Skin: warm, dry  Recent Labs    07/06/22 0339 07/07/22 0544  HGB 12.0 10.4*  HCT 36.8 31.4*    Assessment/Plan: Amanda Ortega is a 32 y.o. G1P1001 s/p VD at [redacted]w[redacted]d   PPD#1 - Doing well  Routine postpartum care Contraception: Depo prior to discharge Feeding: both Dispo: Plan for discharge tomorrow.   LOS: 1 day   Vonna Drafts, MD PGY1 07/07/2022, 8:38 AM

## 2022-07-07 NOTE — Lactation Note (Addendum)
This note was copied from a baby's chart. Lactation Consultation Note  Patient Name: Amanda Ortega [id number] Date: 07/07/2022 Age:32 years Reason for consult: Initial assessment;Follow-up assessment;1st time breastfeeding;Mother's request;Term;Infant < 6lbs  P1, Baby < 6 lbs.  Reviewed hand expression with drops expressed.  Encouraged breastfeeding before formula.   Baby latched briefly in football position.  Reviewed DEBP and reviewed milk storage guidelines. Plan: Offer breast when baby cues that she is hungry, or awaken baby for feeding at 3 hrs.  Breast feed baby, asking for help prn.  Limit to 30 mins so not to overtire baby. If baby does not latch after 10 min of attempt - give supplemental breastmilk/formula.  Slow flow nipple bottle is an option.   Pump both breasts 15-20 minutes on initiation setting, adding breast massage and hand expression to collect as much colostrum as possible to feed baby.  Feed baby 5-10 ml EBM+/formula after breastfeeding increasing per day of life and as baby desires.     Maternal Data Has patient been taught Hand Expression?: Yes  Feeding Mother's Current Feeding Choice: Breast Milk and Formula Nipple Type: Extra Slow Flow  LATCH Score Latch: Repeated attempts needed to sustain latch, nipple held in mouth throughout feeding, stimulation needed to elicit sucking reflex.  Audible Swallowing: A few with stimulation  Type of Nipple: Everted at rest and after stimulation  Comfort (Breast/Nipple): Soft / non-tender  Hold (Positioning): Assistance needed to correctly position infant at breast and maintain latch.  LATCH Score: 7   Lactation Tools Discussed/Used Tools: Pump;Flanges Flange Size: 21 Breast pump type: Double-Electric Breast Pump Pump Education: Setup, frequency, and cleaning;Milk Storage Reason for Pumping:  (stimulation and supplementation) Pumping frequency: q 3 hours  Interventions Interventions: Breast feeding basics  reviewed;Assisted with latch;Skin to skin;Adjust position;Support pillows;DEBP;Education  Discharge Pump: Personal;DEBP (Motif)  Consult Status Consult Status: Follow-up Date: 07/08/22 Follow-up type: In-patient    Dahlia Byes [place] 07/07/2022, 9:42 AM

## 2022-07-08 ENCOUNTER — Encounter: Payer: Commercial Managed Care - PPO | Admitting: Obstetrics and Gynecology

## 2022-07-08 MED ORDER — IBUPROFEN 600 MG PO TABS: 600.0000 mg | ORAL_TABLET | Freq: Four times a day (QID) | ORAL | 0 refills | Status: DC | PRN

## 2022-07-08 MED ORDER — ACETAMINOPHEN 500 MG PO TABS: 1000.0000 mg | ORAL_TABLET | Freq: Four times a day (QID) | ORAL | 0 refills | Status: DC

## 2022-07-08 NOTE — Lactation Note (Signed)
This note was copied from a baby's chart. Lactation Consultation Note  Patient Name: Amanda Ortega [id number] Date: 07/08/2022 Age:30 hours  Reason for consult: Mother's request;Infant < 6lbs;Breastfeeding assistance  P1, GA [redacted]w[redacted]d, 2% weight loss  Mother requested assistance with latch. Basic breastfeeding education reviewed and adjustments for a deeper latch. Baby fed well with occasional swallows, more swallows when mother did alternate breast massage.   Infant has been supplementing due to birth weight. Mother will continue to latch baby first and supplement with her expressed milk or formula afterwards as needed until infant is breastfeeding well with weight gain.   Mother is very motivated to breast feed. She has requested a referral sent for OP LC and referral sent to OP LC at [facility].    Maternal Data Has patient been taught Hand Expression?: Yes Does the patient have breastfeeding experience prior to this delivery?: No  Feeding Mother's Current Feeding Choice: Breast Milk and Formula  LATCH Score Latch: Repeated attempts needed to sustain latch, nipple held in mouth throughout feeding, stimulation needed to elicit sucking reflex.  Audible Swallowing: A few with stimulation  Type of Nipple: Everted at rest and after stimulation  Comfort (Breast/Nipple): Soft / non-tender  Hold (Positioning): Assistance needed to correctly position infant at breast and maintain latch.  LATCH Score: 7   Lactation Tools Discussed/Used    Interventions Interventions: Breast feeding basics reviewed;Assisted with latch;Skin to skin;Breast massage;Breast compression;Adjust position;Support pillows;Position options  Discharge Discharge Education: Engorgement and breast care;Warning signs for feeding baby;Outpatient recommendation;Outpatient Epic message sent  Consult Status Consult Status: Complete Date: 07/08/22    Omar Person 07/08/2022, 11:35 AM

## 2022-07-11 ENCOUNTER — Inpatient Hospital Stay (HOSPITAL_COMMUNITY)
Admission: RE | Admit: 2022-07-11 | Payer: Commercial Managed Care - PPO | Source: Home / Self Care | Admitting: Family Medicine

## 2022-07-11 ENCOUNTER — Inpatient Hospital Stay (HOSPITAL_COMMUNITY): Payer: Commercial Managed Care - PPO

## 2022-07-16 ENCOUNTER — Ambulatory Visit (INDEPENDENT_AMBULATORY_CARE_PROVIDER_SITE_OTHER): Payer: Commercial Managed Care - PPO | Admitting: Student

## 2022-07-16 ENCOUNTER — Encounter: Payer: Self-pay | Admitting: Student

## 2022-07-16 DIAGNOSIS — K5901 Slow transit constipation: Secondary | ICD-10-CM

## 2022-07-16 NOTE — Patient Instructions (Addendum)
Things we discussed today:  Miralax packet/serving 1x/day or every other day for x1week Apply Witch Hazel Pads(Tucks Pads) on top of menstrual pad or just along your undergarments. Can be placed from front to back depending on your preference Increase Water intake  Continue with stool softener daily until postpartum visit You can also incorporate Metamucil Powder Daily as needed Keep in mind that Iron tablets or iron rich foods can also lead to constipation

## 2022-07-16 NOTE — Progress Notes (Signed)
Patient presents for rectal pressure and bleeding. Patient states that her last bowel movement which was very hard. She is straining when she has a bowel movement, but not able to have a full bowel movement. Denies any vaginal concerns.

## 2022-07-18 NOTE — Progress Notes (Signed)
Post Partum Visit Note  Amanda Ortega is a 32 y.o. G39P1001 female who presents for a postpartum visit. She is 10 day postpartum following a normal spontaneous vaginal delivery.  I have fully reviewed the prenatal and intrapartum course. The delivery was at 40 gestational weeks.  Postpartum course has been uncomplicated outside of pressure in buttocks when sitting. Baby is doing well. Bleeding thin lochia. Bowel function is abnormal: patient remains constipated . States she has not had a normal bowel movement since prior to delivery. Patient is not sexually active.    Edinburgh Postnatal Depression Scale - 07/16/22 1444       Edinburgh Postnatal Depression Scale:  In the Past 7 Days   I have been able to laugh and see the funny side of things. 0    I have looked forward with enjoyment to things. 0    I have blamed myself unnecessarily when things went wrong. 0    I have been anxious or worried for no good reason. 0    I have felt scared or panicky for no good reason. 0    Things have been getting on top of me. 0    I have been so unhappy that I have had difficulty sleeping. 0    I have felt sad or miserable. 0    I have been so unhappy that I have been crying. 0    The thought of harming myself has occurred to me. 0    Edinburgh Postnatal Depression Scale Total 0             Health Maintenance Due  Topic Date Due   COVID-19 Vaccine (3 - Moderna risk series) 11/18/2019    The following portions of the patient's history were reviewed and updated as appropriate: allergies, current medications, past family history, past medical history, past social history, past surgical history, and problem list.  Review of Systems A comprehensive review of systems was negative except for: Gastrointestinal: positive for constipation and diarrhea  Objective:  BP 100/68   Pulse 98   Wt 165 lb 3.2 oz (74.9 kg)   LMP 08/25/2021   BMI 27.49 kg/m    General:  alert and cooperative   Breasts:   normal  Lungs: Normalwork of breathing  Heart:  regular rate and rhythm  Abdomen: normal findings: no masses palpable and soft, non-tender   Wound N/a  GU exam:  normal, normal rectal exam       Assessment:  1. Encounter for postpartum visit   2. Slow transit constipation   Reassuring  postpartum exam.   Plan:    1. Encounter for postpartum visit - Plan to follow-up at 6 week appointment to assess health status -Today's exam:  ~Mood and well being: Patient with negative depression screening today. Reviewed local resources for support.  ~ Patient had a Vaginal, no problems at delivery. Perineal healing reviewed. Patient expressed understanding. Patient does not have urinary incontinence ~ Patient is not safe to resume physical and sexual activity -Encouraged family support of 4 hrs of uninterrupted sleep to help with mood and fatigue  2. Slow transit constipation Things we discussed today:  Miralax packet/serving 1x/day or every other day for x1week Apply Witch Hazel Pads(Tucks Pads) on top of menstrual pad or just along undergarments. Can be placed from front to back depending on your preference Increase Water intake  Continue with stool softener daily until postpartum visit Option to incorporate Metamucil Powder Daily as needed Keep in  mind that Iron tablets or iron rich foods can also lead to constipation    Corlis Hove, NP [facility] [employer], [facility]

## 2022-07-23 ENCOUNTER — Ambulatory Visit (INDEPENDENT_AMBULATORY_CARE_PROVIDER_SITE_OTHER): Payer: Commercial Managed Care - PPO | Admitting: Licensed Clinical Social Worker

## 2022-07-25 NOTE — BH Specialist Note (Signed)
Integrated Behavioral Health via Telemedicine Visit  07/25/2022 Amanda Ortega [id number]  Number of Integrated Behavioral Health Clinician visits: 2 Session Start time:  9:00am Session End time: 9:15am Total time in minutes: 15 min via mychart video   Referring Provider: discharge instructions  Patient/Family location: Home  Amanda Ortega Provider location: [facility]  All persons participating in visit: Pt Amanda Ortega and LCSW Amanda Ortega  Types of Service: Video visit and General Behavioral Integrated Care (BHI)  I connected with Amanda Ortega n/Amanda via  Telephone or Engineer, civil (consulting)  (Video is Surveyor, mining) and verified that I am speaking with the correct person using two identifiers. Discussed confidentiality: Yes   I discussed the limitations of telemedicine and the availability of in person appointments.  Discussed there is Amanda possibility of technology failure and discussed alternative modes of communication if that failure occurs.  I discussed that engaging in this telemedicine visit, they consent to the provision of behavioral healthcare and the services will be billed under their insurance.  Patient and/or legal guardian expressed understanding and consented to Telemedicine visit: Yes   Presenting Concerns: Patient and/or family reports the following symptoms/concerns: no reported concerns  Duration of problem: n/Amanda; Severity of problem: mild  Patient and/or Family's Strengths/Protective Factors: Concrete supports in place (healthy food, safe environments, etc.)  Goals Addressed: Patient will:  Reduce symptoms of:  mood check    Increase knowledge and/or ability of: healthy habits   Demonstrate ability to: Increase healthy adjustment to current life circumstances  Progress towards Goals: Ongoing  Interventions: Interventions utilized:  Supportive Counseling Standardized Assessments completed: Edinburgh Postnatal Depression  Patient  and/or Family Response: Amanda Ortega reports bonding well with newborn and does not report any concerns   Assessment: Patient reports no concerns .   Patient may benefit from integrated behavioral health.  Plan: Follow up with behavioral health clinician on : as needed  Behavioral recommendations: no recommendations  Referral(s): Integrated [employer] (In Clinic)  I discussed the assessment and treatment plan with the patient and/or parent/guardian. They were provided an opportunity to ask questions and all were answered. They agreed with the plan and demonstrated an understanding of the instructions.   They were advised to call back or seek an in-person evaluation if the symptoms worsen or if the condition fails to improve as anticipated.  Amanda Saxon, LCSW

## 2022-08-07 ENCOUNTER — Encounter: Payer: Self-pay | Admitting: Obstetrics and Gynecology

## 2022-08-25 ENCOUNTER — Ambulatory Visit: Payer: Commercial Managed Care - PPO | Admitting: Obstetrics and Gynecology

## 2022-08-31 DIAGNOSIS — Z3483 Encounter for supervision of other normal pregnancy, third trimester: Secondary | ICD-10-CM | POA: Diagnosis not present

## 2022-08-31 DIAGNOSIS — Z3482 Encounter for supervision of other normal pregnancy, second trimester: Secondary | ICD-10-CM | POA: Diagnosis not present

## 2022-09-04 ENCOUNTER — Encounter: Payer: Commercial Managed Care - PPO | Admitting: Internal Medicine

## 2022-09-05 ENCOUNTER — Encounter: Payer: Self-pay | Admitting: Obstetrics and Gynecology

## 2022-09-05 ENCOUNTER — Ambulatory Visit (INDEPENDENT_AMBULATORY_CARE_PROVIDER_SITE_OTHER): Payer: Commercial Managed Care - PPO | Admitting: Student

## 2022-09-05 DIAGNOSIS — R102 Pelvic and perineal pain: Secondary | ICD-10-CM

## 2022-09-05 LAB — POCT URINALYSIS DIPSTICK: Bilirubin, UA: POSITIVE

## 2022-09-05 NOTE — Progress Notes (Unsigned)
..   Post Partum Visit Note  Amanda Ortega is a 32 y.o. G46P1001 female who presents for a postpartum visit. She is 8 weeks postpartum following a normal spontaneous vaginal delivery.  I have fully reviewed the prenatal and intrapartum course. The delivery was at 40.2 gestational weeks.  Anesthesia: epidural. Postpartum course has been good. Baby is doing well. Baby is feeding by both breast and bottle - Similac Advance. Bleeding staining only. Bowel function is normal. Bladder function is normal. Patient is sexually active. Pelvic discomfort reported to RN, but not mentioned during visit with provider.  Contraception method is Depo-Provera injections. Postpartum depression screening: positive.   The pregnancy intention screening data noted above was reviewed. Potential methods of contraception were discussed. The patient elected to proceed with Depo.   Edinburgh Postnatal Depression Scale - 09/05/22 1026       Edinburgh Postnatal Depression Scale:  In the Past 7 Days   I have been able to laugh and see the funny side of things. 0    I have looked forward with enjoyment to things. 0    I have blamed myself unnecessarily when things went wrong. 0    I have been anxious or worried for no good reason. 0    I have felt scared or panicky for no good reason. 0    Things have been getting on top of me. 0    I have been so unhappy that I have had difficulty sleeping. 0    I have felt sad or miserable. 0    I have been so unhappy that I have been crying. 0    The thought of harming myself has occurred to me. 0    Edinburgh Postnatal Depression Scale Total 0             Health Maintenance Due  Topic Date Due   COVID-19 Vaccine (3 - Moderna risk series) 11/18/2019    The following portions of the patient's history were reviewed and updated as appropriate: allergies, current medications, past family history, past medical history, past social history, past surgical history, and problem  list.  Review of Systems Pertinent items are noted in HPI.  Objective:  BP 106/66   Pulse 96   Wt 153 lb 3.2 oz (69.5 kg)   LMP 08/25/2021   Breastfeeding Yes   BMI 25.49 kg/m    General:  alert, cooperative, and appears stated age   Breasts:  normal  Lungs: Normal effort  Heart:  regular rate and rhythm  Abdomen: soft, non-tender; bowel sounds normal; no masses,  no organomegaly   Wound Not indicated  GU exam:  not indicated       Assessment:   1. Encounter for postpartum care of lactating mother 2. Pelvic pain  Normal postpartum exam.   Plan:   Essential components of care per ACOG recommendations:  1.  Mood and well being: Patient with negative depression screening today. Reviewed local resources for support.  - Patient tobacco use? No.   - hx of drug use? No.    2. Infant care and feeding:  -Patient currently breastmilk feeding? Yes. Discussed returning to work and pumping. Reviewed importance of draining breast regularly to support lactation.  -Social determinants of health (SDOH) reviewed in EPIC. No concerns  3. Sexuality, contraception and birth spacing - Patient does not want a pregnancy in the next year.  Desired family size is 1 child.  - Reviewed reproductive life planning. Reviewed contraceptive methods based on  pt preferences and effectiveness.  Patient desired Hormonal Injection today.   - Discussed birth spacing of 18 months  4. Sleep and fatigue -Encouraged family/partner/community support of 4 hrs of uninterrupted sleep to help with mood and fatigue  5. Physical Recovery  - Discussed patients delivery and complications. She describes her labor as good. - Patient had a Vaginal, no problems at delivery. Patient had no laceration. Perineal healing reviewed. Patient expressed understanding - Patient has urinary incontinence? No. - Patient is safe to resume physical and sexual activity -  No complaints or concerns voiced during visit with  provider  6.  Health Maintenance - HM due items addressed No - none due - Last pap smear  Diagnosis  Date Value Ref Range Status  08/29/2021   Final   - Negative for intraepithelial lesion or malignancy (NILM)   Pap smear not done at today's visit.  - Encouraged 1 year follow-up for annual visit -Breast Cancer screening indicated? No.   7. Chronic Disease/Pregnancy Condition follow up: None  - PCP follow up  Corlis Hove, NP [facility], [facility]

## 2022-09-07 ENCOUNTER — Encounter: Payer: Self-pay | Admitting: Student

## 2022-09-10 ENCOUNTER — Ambulatory Visit (INDEPENDENT_AMBULATORY_CARE_PROVIDER_SITE_OTHER): Payer: Commercial Managed Care - PPO | Admitting: Internal Medicine

## 2022-09-10 ENCOUNTER — Encounter: Payer: Self-pay | Admitting: Internal Medicine

## 2022-09-10 VITALS — BP 102/72 | HR 95 | Temp 98.4°F | Ht 65.5 in | Wt 152.7 lb

## 2022-09-10 DIAGNOSIS — Z0000 Encounter for general adult medical examination without abnormal findings: Secondary | ICD-10-CM | POA: Diagnosis not present

## 2022-09-10 DIAGNOSIS — Z13220 Encounter for screening for lipoid disorders: Secondary | ICD-10-CM

## 2022-09-10 LAB — COMPREHENSIVE METABOLIC PANEL
ALT: 32 U/L (ref 0–35)
AST: 28 U/L (ref 0–37)
Albumin: 4.2 g/dL (ref 3.5–5.2)
Alkaline Phosphatase: 62 U/L (ref 39–117)
BUN: 14 mg/dL (ref 6–23)
CO2: 25 mEq/L (ref 19–32)
Calcium: 9.1 mg/dL (ref 8.4–10.5)
Chloride: 105 mEq/L (ref 96–112)
Creatinine, Ser: 0.72 mg/dL (ref 0.40–1.20)
GFR: 112.82 mL/min (ref >60.00)
Glucose, Bld: 88 mg/dL (ref 70–99)
Potassium: 3.6 mEq/L (ref 3.5–5.1)
Sodium: 137 mEq/L (ref 135–145)
Total Bilirubin: 0.5 mg/dL (ref 0.2–1.2)
Total Protein: 7.2 g/dL (ref 6.0–8.3)

## 2022-09-10 LAB — CBC WITH DIFFERENTIAL/PLATELET
Basophils Absolute: 0 10*3/uL (ref 0.0–0.1)
Basophils Relative: 0.4 % (ref 0.0–3.0)
Eosinophils Absolute: 0.3 10*3/uL (ref 0.0–0.7)
Eosinophils Relative: 5 % (ref 0.0–5.0)
HCT: 34 % — ABNORMAL LOW (ref 36.0–46.0)
Hemoglobin: 11.3 g/dL — ABNORMAL LOW (ref 12.0–15.0)
Lymphocytes Relative: 39.2 % (ref 12.0–46.0)
Lymphs Abs: 2 10*3/uL (ref 0.7–4.0)
MCHC: 33.3 g/dL (ref 30.0–36.0)
MCV: 94.6 fl (ref 78.0–100.0)
Monocytes Absolute: 0.4 10*3/uL (ref 0.1–1.0)
Monocytes Relative: 8.3 % (ref 3.0–12.0)
Neutro Abs: 2.5 10*3/uL (ref 1.4–7.7)
Neutrophils Relative %: 47.1 % (ref 43.0–77.0)
Platelets: 227 10*3/uL (ref 150.0–400.0)
RBC: 3.6 Mil/uL — ABNORMAL LOW (ref 3.87–5.11)
RDW: 12.2 % (ref 11.5–15.5)
WBC: 5.2 10*3/uL (ref 4.0–10.5)

## 2022-09-10 LAB — VITAMIN B12: Vitamin B-12: 636 pg/mL (ref 211–911)

## 2022-09-10 LAB — LIPID PANEL
Cholesterol: 131 mg/dL (ref 0–200)
HDL: 38.1 mg/dL — ABNORMAL LOW (ref >39.00)
LDL Cholesterol: 77 mg/dL (ref 0–99)
NonHDL: 92.61
Total CHOL/HDL Ratio: 3
Triglycerides: 79 mg/dL (ref 0.0–149.0)
VLDL: 15.8 mg/dL (ref 0.0–40.0)

## 2022-09-10 LAB — TSH: TSH: 0.92 u[IU]/mL (ref 0.35–5.50)

## 2022-09-10 LAB — VITAMIN D 25 HYDROXY (VIT D DEFICIENCY, FRACTURES): VITD: 19.58 ng/mL — ABNORMAL LOW (ref 30.00–100.00)

## 2022-09-10 NOTE — Addendum Note (Signed)
Addended by: Natale Milch D on: 09/10/2022 03:42 PM   Modules accepted: Orders

## 2022-09-10 NOTE — Progress Notes (Signed)
Established Patient Office Visit     CC/Reason for Visit: Annual preventive exam  HPI: Amanda Ortega is a 32 y.o. female who is coming in today for the above mentioned reasons.  She has no past medical history of significance.  She has a 44-month-old daughter.  She is feeling well and has no acute concerns.  She is overdue for an eye exam, has routine dental care.  Immunizations are up-to-date.  She follows with GYN routinely.   Past Medical/Surgical History: Past Medical History:  Diagnosis Date   Anemia    Asthma    Eczema    Encounter for supervision of normal first pregnancy, third trimester 12/05/2021          Nursing Staff  Provider  Office Location  [facility]  Dating   06/01/2022, by Last Menstrual Period  [facility] Model  [x]  Traditional  [ ]  Centering  [ ]  Mom-Baby Dyad  Anatomy US   normal  Language   English        Flu Vaccine   11/2021  Genetic/Carrier Screen   NIPS:   Low risks  AFP:   neg  Horizon: negative x 4  TDaP Vaccine    06/10/2022  Hgb A1C or   GTT  Early   Third trimester : normal  COVID Va   Vaginal delivery 07/06/2022    Past Surgical History:  Procedure Laterality Date   DENTAL SURGERY      Social History:  reports that she has never smoked. She has never used smokeless tobacco. She reports that she does not currently use alcohol. She reports that she does not use drugs.  Allergies: Allergies  Allergen Reactions   Shellfish Allergy Swelling    Lip swelling    Naproxen Hives and Swelling    Tongue swelling    Family History:  Family History  Problem Relation Age of Onset   Hypertension Mother    Colon cancer Mother    Hypertension Father    Hyperlipidemia Father    Asthma Maternal Grandmother      Current Outpatient Medications:    albuterol (VENTOLIN HFA) 108 (90 Base) MCG/ACT inhaler, Inhale 2 puffs into the lungs every 4 (four) hours as needed for wheezing or shortness of breath., Disp: 1 each, Rfl: 2   Iron, Ferrous Sulfate, 325 (65 Fe) MG  TABS, Take 325 mg by mouth 2 (two) times daily., Disp: 60 tablet, Rfl: 2   Prenatal MV-Min-Fe Fum-FA-DHA ([employer] PRENATAL MULTI+DHA) 27-0.8-250 MG CAPS, Take 1 capsule by mouth daily., Disp: , Rfl:   Review of Systems:  Negative unless indicated in HPI.   Physical Exam: Vitals:   09/10/22 1329  BP: 102/72  Pulse: 95  Temp: 98.4 F (36.9 C)  TempSrc: Oral  SpO2: 100%  Weight: 152 lb 11.2 oz (69.3 kg)  Height: 5' 5.5" (1.664 m)    Body mass index is 25.02 kg/m.   Physical Exam Vitals reviewed.  Constitutional:      General: She is not in acute distress.    Appearance: Normal appearance. She is not ill-appearing, toxic-appearing or diaphoretic.  HENT:     Head: Normocephalic.     Right Ear: Tympanic membrane, ear canal and external ear normal. There is no impacted cerumen.     Left Ear: Tympanic membrane, ear canal and external ear normal. There is no impacted cerumen.     Nose: Nose normal.     Mouth/Throat:     Mouth: Mucous membranes are moist.  Pharynx: Oropharynx is clear. No oropharyngeal exudate or posterior oropharyngeal erythema.  Eyes:     General: No scleral icterus.       Right eye: No discharge.        Left eye: No discharge.     Conjunctiva/sclera: Conjunctivae normal.     Pupils: Pupils are equal, round, and reactive to light.  Neck:     Vascular: No carotid bruit.  Cardiovascular:     Rate and Rhythm: Normal rate and regular rhythm.     Pulses: Normal pulses.     Heart sounds: Normal heart sounds.  Pulmonary:     Effort: Pulmonary effort is normal. No respiratory distress.     Breath sounds: Normal breath sounds.  Abdominal:     General: Abdomen is flat. Bowel sounds are normal.     Palpations: Abdomen is soft.  Musculoskeletal:        General: Normal range of motion.     Cervical back: Normal range of motion.  Skin:    General: Skin is warm and dry.  Neurological:     General: No focal deficit present.     Mental Status: She is alert and  oriented to person, place, and time. Mental status is at baseline.  Psychiatric:        Mood and Affect: Mood normal.        Behavior: Behavior normal.        Thought Content: Thought content normal.        Judgment: Judgment normal.     Flowsheet Row Office Visit from 09/10/2022 in [facility] [place]  PHQ-9 Total Score 0        Impression and Plan:  Encounter for preventive health examination -     CBC with Differential/Platelet; Future -     Comprehensive metabolic panel; Future -     TSH; Future -     Lipid panel; Future -     Vitamin B12; Future -     VITAMIN D 25 Hydroxy (Vit-D Deficiency, Fractures); Future  -Recommend routine eye and dental care. -Healthy lifestyle discussed in detail. -Labs to be updated today. -Prostate cancer screening: N/A Health Maintenance  Topic Date Due   COVID-19 Vaccine (3 - Moderna risk series) 11/18/2019   Flu Shot  10/23/2022   Pap Smear  08/29/2024   Pap Smear  08/29/2024   DTaP/Tdap/Td vaccine (5 - Td or Tdap) 06/09/2032   HPV Vaccine  Completed   Hepatitis C Screening  Completed   HIV Screening  Completed         Genavie Boettger Philip Aspen, MD [facility]

## 2022-09-11 ENCOUNTER — Encounter: Payer: Self-pay | Admitting: Internal Medicine

## 2022-09-11 ENCOUNTER — Other Ambulatory Visit: Payer: Self-pay | Admitting: Internal Medicine

## 2022-09-11 DIAGNOSIS — E559 Vitamin D deficiency, unspecified: Secondary | ICD-10-CM

## 2022-09-11 MED ORDER — VITAMIN D (ERGOCALCIFEROL) 1.25 MG (50000 UNIT) PO CAPS
50000.0000 [IU] | ORAL_CAPSULE | ORAL | 0 refills | Status: AC
Start: 2022-09-11 — End: 2022-11-28

## 2022-09-29 ENCOUNTER — Ambulatory Visit (INDEPENDENT_AMBULATORY_CARE_PROVIDER_SITE_OTHER): Payer: Commercial Managed Care - PPO | Admitting: *Deleted

## 2022-09-29 DIAGNOSIS — Z3042 Encounter for surveillance of injectable contraceptive: Secondary | ICD-10-CM

## 2022-09-29 MED ORDER — MEDROXYPROGESTERONE ACETATE 150 MG/ML IM SUSP: 150.0000 mg | INTRAMUSCULAR | 4 refills | Status: DC

## 2022-09-29 MED ORDER — MEDROXYPROGESTERONE ACETATE 150 MG/ML IM SUSP
150.0000 mg | Freq: Once | INTRAMUSCULAR | Status: AC
  Administered 2022-09-29: 150 mg via INTRAMUSCULAR

## 2022-09-29 NOTE — Progress Notes (Signed)
Date last pap: 08/29/21. Last Depo-Provera: 07/07/22. Side Effects if any: NONE. Serum HCG indicated? NA. Depo-Provera 150 mg IM given by: Chriss Driver . Next appointment due 9/23-10/7.     Office Supplied

## 2022-09-30 NOTE — Progress Notes (Signed)
Patient was assessed and managed by nursing staff during this encounter. I have reviewed the chart and agree with the documentation and plan. I have also made any necessary editorial changes.  Travonna Swindle A Jakota Manthei, MD 09/30/2022 1:10 PM   

## 2022-10-17 ENCOUNTER — Encounter (HOSPITAL_COMMUNITY): Payer: Self-pay

## 2022-10-17 ENCOUNTER — Ambulatory Visit (HOSPITAL_COMMUNITY)
Admission: EM | Admit: 2022-10-17 | Discharge: 2022-10-17 | Disposition: A | Discharge status: Home / Self Care | Payer: Commercial Managed Care - PPO | Source: Home / Self Care

## 2022-10-17 ENCOUNTER — Other Ambulatory Visit (HOSPITAL_COMMUNITY): Payer: Self-pay

## 2022-10-17 DIAGNOSIS — B370 Candidal stomatitis: Secondary | ICD-10-CM | POA: Diagnosis not present

## 2022-10-17 DIAGNOSIS — J029 Acute pharyngitis, unspecified: Secondary | ICD-10-CM | POA: Insufficient documentation

## 2022-10-17 DIAGNOSIS — B349 Viral infection, unspecified: Secondary | ICD-10-CM | POA: Insufficient documentation

## 2022-10-17 DIAGNOSIS — Z1152 Encounter for screening for COVID-19: Secondary | ICD-10-CM | POA: Diagnosis not present

## 2022-10-17 DIAGNOSIS — R509 Fever, unspecified: Secondary | ICD-10-CM | POA: Diagnosis present

## 2022-10-17 LAB — POCT RAPID STREP A (OFFICE): Rapid Strep A Screen: NEGATIVE

## 2022-10-17 MED ORDER — NYSTATIN 100000 UNIT/ML MT SUSP
5.0000 mL | Freq: Four times a day (QID) | OROMUCOSAL | 0 refills | Status: DC
  Filled 2022-10-17: qty 125, 7d supply, fill #0
  Filled 2022-10-21 (×2): qty 125, 7d supply, fill #1

## 2022-10-17 MED ORDER — IBUPROFEN 100 MG/5ML PO SUSP
ORAL | Status: AC
  Filled 2022-10-17: qty 40

## 2022-10-17 MED ORDER — PREDNISOLONE SODIUM PHOSPHATE 15 MG/5ML PO SOLN
ORAL | Status: AC
  Filled 2022-10-17: qty 3

## 2022-10-17 MED ORDER — IBUPROFEN 100 MG/5ML PO SUSP
800.0000 mg | Freq: Once | ORAL | Status: AC
  Administered 2022-10-17: 800 mg via ORAL

## 2022-10-17 MED ORDER — PREDNISOLONE SODIUM PHOSPHATE 15 MG/5ML PO SOLN
40.0000 mg | Freq: Once | ORAL | Status: AC
  Administered 2022-10-17: 40 mg via ORAL

## 2022-10-17 MED ORDER — NYSTATIN 100000 UNIT/ML MT SUSP: 500000.0000 [IU] | Freq: Four times a day (QID) | OROMUCOSAL | 0 refills | Status: AC

## 2022-10-17 NOTE — ED Provider Notes (Addendum)
[facility]    CSN: [id number] Arrival date & time: 10/17/22  1232      History   Chief Complaint Chief Complaint  Patient presents with   Fever   Sore Throat    HPI Amanda Ortega is a 32 y.o. female.   Patient presents to clinic with fever, sore throat, generalized weakness and chills that started yesterday.  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  She is breast-feeding.  Reports she feels like her throat and tongue are swelling, does have a history of strep throat.  She did take Tylenol yesterday, has taken Mucinex today.  Has had water and some yogurt today.  Endorses trouble and pain with swallowing.  Denies any shortness of breath or wheezing.  Feels like her whole mouth is swollen.   She has not used her albuterol inhaler within the past few months.  She has not been on antibiotics recently.  Her child does not have oral thrush.  She does use a retainer and cleans this frequently.   The history is provided by the patient and medical records.  Fever Associated symptoms: sore throat   Associated symptoms: no chest pain and no cough   Sore Throat Pertinent negatives include no chest pain and no shortness of breath.    Past Medical History:  Diagnosis Date   Anemia    Asthma    Eczema    Encounter for supervision of normal first pregnancy, third trimester 12/05/2021          Nursing Staff  Provider  Office Location  [facility]  Dating   06/01/2022, by Last Menstrual Period  [facility] Model  [x]  Traditional  [ ]  Centering  [ ]  Mom-Baby Dyad  Anatomy US   normal  Language   English        Flu Vaccine   11/2021  Genetic/Carrier Screen   NIPS:   Low risks  AFP:   neg  Horizon: negative x 4  TDaP Vaccine    06/10/2022  Hgb A1C or   GTT  Early   Third trimester : normal  COVID [facility]   Vaginal delivery 07/06/2022    Patient Active Problem List   Diagnosis Date Noted   Vitamin D deficiency 09/11/2022    Past Surgical History:   Procedure Laterality Date   DENTAL SURGERY      OB History     Gravida  1   Para  1   Term  1   Preterm  0   AB  0   Living  1      SAB  0   IAB  0   Ectopic  0   Multiple  0   Live Births  1            Home Medications    Prior to Admission medications   Medication Sig Start Date End Date Taking? Authorizing Provider  Iron, Ferrous Sulfate, 325 (65 Fe) MG TABS Take 325 mg by mouth 2 (two) times daily. 09/05/21  Yes Philip Aspen, Limmie Patricia, MD  medroxyPROGESTERone (DEPO-PROVERA) 150 MG/ML injection Inject 1 mL (150 mg total) into the muscle every 3 (three) months. 09/29/22  Yes Warden Fillers, MD  nystatin (MYCOSTATIN) 100000 UNIT/ML suspension Take 5 mLs (500,000 Units total) by mouth 4 (four) times daily for 7 days. 10/17/22 10/24/22 Yes Lety Cullens, [place] N, FNP  Prenatal MV-Min-Fe Fum-FA-DHA ([employer] PRENATAL MULTI+DHA) 27-0.8-250 MG  CAPS Take 1 capsule by mouth daily.   Yes [provider]  Vitamin D, Ergocalciferol, (DRISDOL) 1.25 MG (50000 UNIT) CAPS capsule Take 1 capsule (50,000 Units total) by mouth every 7 (seven) days for 12 doses. 09/11/22 11/28/22 Yes Henderson Cloud, MD  albuterol (VENTOLIN HFA) 108 (90 Base) MCG/ACT inhaler Inhale 2 puffs into the lungs every 4 (four) hours as needed for wheezing or shortness of breath. 02/17/22   Philip Aspen, Limmie Patricia, MD    Family History Family History  Problem Relation Age of Onset   Hypertension Mother    Colon cancer Mother    Hypertension Father    Hyperlipidemia Father    Asthma Maternal Grandmother     Social History Social History   Tobacco Use   Smoking status: Never   Smokeless tobacco: Never  Vaping Use   Vaping status: Never Used  Substance Use Topics   Alcohol use: Not Currently    Comment: social, not since confirmed pregnancy   Drug use: Never     Allergies   Shellfish allergy and Naproxen   Review of Systems Review of Systems  Constitutional:  Positive  for fever.  HENT:  Positive for sore throat and trouble swallowing.   Respiratory:  Negative for cough, shortness of breath and wheezing.   Cardiovascular:  Negative for chest pain.     Physical Exam Triage Vital Signs ED Triage Vitals  Encounter Vitals Group     BP 10/17/22 1432 106/71     Systolic BP Percentile --      Diastolic BP Percentile --      Pulse Rate 10/17/22 1432 (!) 125     Resp 10/17/22 1432 18     Temp 10/17/22 1432 (!) 101.3 F (38.5 C)     Temp Source 10/17/22 1432 Oral     SpO2 10/17/22 1432 97 %     Weight 10/17/22 1431 156 lb (70.8 kg)     Height 10/17/22 1431 5\' 5"  (1.651 m)     Head Circumference --      Peak Flow --      Pain Score 10/17/22 1430 8     Pain Loc --      Pain Education --      Exclude from Growth Chart --    No data found.  Updated Vital Signs BP 106/71 (BP Location: Left Arm)   Pulse (!) 125   Temp (!) 101.3 F (38.5 C) (Oral) Comment: micinex 3 hours ago  Resp 18   Ht 5\' 5"  (1.651 m)   Wt 156 lb (70.8 kg)   LMP  (LMP Unknown)   SpO2 97%   Breastfeeding No   BMI 25.96 kg/m   Visual Acuity Right Eye Distance:   Left Eye Distance:   Bilateral Distance:    Right Eye Near:   Left Eye Near:    Bilateral Near:     Physical Exam Vitals and nursing note reviewed.  Constitutional:      Appearance: Normal appearance. She is well-developed.  HENT:     Head: Normocephalic and atraumatic.     Right Ear: External ear normal.     Left Ear: External ear normal.     Nose: Nose normal. No congestion or rhinorrhea.     Mouth/Throat:     Mouth: Mucous membranes are moist. Oral lesions present.     Dentition: Normal dentition.     Tongue: Lesions present.     Pharynx: Pharyngeal swelling and posterior  oropharyngeal erythema present.     Tonsils: No tonsillar exudate or tonsillar abscesses. 3+ on the right. 3+ on the left.     Comments: White patches throughout mouth and tongue.  Eyes:     Conjunctiva/sclera: Conjunctivae  normal.  Cardiovascular:     Rate and Rhythm: Tachycardia present.  Pulmonary:     Effort: Pulmonary effort is normal. No respiratory distress.  Musculoskeletal:     Cervical back: Normal range of motion.  Lymphadenopathy:     Cervical: Cervical adenopathy present.  Skin:    General: Skin is warm and dry.  Neurological:     General: No focal deficit present.     Mental Status: She is alert and oriented to person, place, and time.  Psychiatric:        Mood and Affect: Mood normal.        Behavior: Behavior normal. Behavior is cooperative.      UC Treatments / Results  Labs (all labs ordered are listed, but only abnormal results are displayed) Labs Reviewed  CULTURE, GROUP A STREP ([facility])  SARS CORONAVIRUS 2 (TAT 6-24 HRS)  POCT RAPID STREP A (OFFICE)    EKG   Radiology No results found.  Procedures Procedures (including critical care time)  Medications Ordered in UC Medications  ibuprofen (ADVIL) 100 MG/5ML suspension 800 mg (800 mg Oral Given 10/17/22 1448)    Initial Impression / Assessment and Plan / UC Course  I have reviewed the triage vital signs and the nursing notes.  Pertinent labs & imaging results that were available during my care of the patient were reviewed by me and considered in my medical decision making (see chart for details).  Vitals in triage reviewed With patient as he would be likely stable.  Is tachycardic and febrile, given liquid ibuprofen in clinic.  Tongue with diffuse white patches that are removable discrete pain.  Reports oral swelling.  Tonsils with bilateral edema, no exudate.  Rapid strep is negative, will send for culture.  Patient with bodyaches, fever and diarrhea, will swab for COVID-19.  Will treat with nystatin for oral thrush.  Given one time dose of liquid steroid for throat swelling. Symptomatic management discussed.  Strict emergency precautions given.  Plan of care, follow-up care discussed, no questions at this time.  Work  note provided.     Final Clinical Impressions(s) / UC Diagnoses   Final diagnoses:  Oral thrush  Pharyngitis, unspecified etiology  Acute viral syndrome     Discharge Instructions      We have swabbed you for COVID-19 and we will contact you if the results are positive.  We are also sending your strep test off for culture and we will contact you if antibiotics are indicated.  You appear to have oral thrush, please use the nystatin suspension 4 times daily for the next 7 days, or until your oral rash clears (can be up to 14 days).  You can alternate between 500 mg of Tylenol and 800 mg of ibuprofen every 4-6 hours for any fever, body aches and discomfort, both of these are safe during breast-feeding.  Ensure you are staying hydrated with at least 64 ounces of water daily. For diarrhea I recommend a bland diet such as bananas, rice, toast and applesauce.   Please seek immediate care at the nearest emergency department if you are unable to eat or drink, have drooling, are unable to control your secretions, have a high fever despite medication or develop any new concerning symptoms.  ED Prescriptions     Medication Sig Dispense Auth. Provider   nystatin (MYCOSTATIN) 100000 UNIT/ML suspension Take 5 mLs (500,000 Units total) by mouth 4 (four) times daily for 7 days. 473 mL Aybree Lanyon, [place] N, FNP      PDMP not reviewed this encounter.   Jeanne Terrance, [place] N, FNP 10/17/22 1515    Darion Milewski, [place], [place] 10/17/22 [id number]

## 2022-10-17 NOTE — Discharge Instructions (Signed)
We have swabbed you for COVID-19 and we will contact you if the results are positive.  We are also sending your strep test off for culture and we will contact you if antibiotics are indicated.  You appear to have oral thrush, please use the nystatin suspension 4 times daily for the next 7 days, or until your oral rash clears (can be up to 14 days).  You can alternate between 500 mg of Tylenol and 800 mg of ibuprofen every 4-6 hours for any fever, body aches and discomfort, both of these are safe during breast-feeding.  Ensure you are staying hydrated with at least 64 ounces of water daily. For diarrhea I recommend a bland diet such as bananas, rice, toast and applesauce.   Please seek immediate care at the nearest emergency department if you are unable to eat or drink, have drooling, are unable to control your secretions, have a high fever despite medication or develop any new concerning symptoms.

## 2022-10-17 NOTE — ED Triage Notes (Signed)
fever weakness chills  and sore throat onset yesterday. 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 No known direct sick exposure. Swelling in the tongue and throat.

## 2022-10-18 ENCOUNTER — Encounter: Payer: Self-pay | Admitting: Internal Medicine

## 2022-10-21 ENCOUNTER — Other Ambulatory Visit (HOSPITAL_COMMUNITY): Payer: Self-pay

## 2022-10-24 ENCOUNTER — Other Ambulatory Visit (HOSPITAL_COMMUNITY): Payer: Self-pay

## 2022-10-26 ENCOUNTER — Ambulatory Visit
Admission: EM | Admit: 2022-10-26 | Discharge: 2022-10-26 | Disposition: A | Discharge status: Home / Self Care | Payer: Commercial Managed Care - PPO

## 2022-10-26 DIAGNOSIS — L03011 Cellulitis of right finger: Secondary | ICD-10-CM

## 2022-10-26 MED ORDER — DOXYCYCLINE HYCLATE 100 MG PO CAPS: 100.0000 mg | ORAL_CAPSULE | Freq: Two times a day (BID) | ORAL | 0 refills | Status: DC

## 2022-10-26 NOTE — ED Triage Notes (Signed)
"  I am having right hand pain, no injury known". Friday noticed "swelling and pain and now seems to be isolated in index finger".

## 2022-10-26 NOTE — ED Provider Notes (Addendum)
[facility]    CSN: [id number] Arrival date & time: 10/26/22  1311      History   Chief Complaint Chief Complaint  Patient presents with   Hand Pain    Right    HPI Amanda Ortega is a 32 y.o. female.   Patient here today for evaluation of right hand pain that started Friday.  She states that she noticed some swelling and pain that seems to be more isolated to her right index finger but does spread to her wrist area as well.  She has not had any injury that she is aware of.  She denies any fever.  She has not any numbness or tingling.  Movement of index finger worsens pain.  She has tried using topical ice without improvement  The history is provided by the patient.  Hand Pain Pertinent negatives include no abdominal pain and no shortness of breath.    Past Medical History:  Diagnosis Date   Anemia    Asthma    Eczema    Encounter for supervision of normal first pregnancy, third trimester 12/05/2021          Nursing Staff  Provider  Office Location  [facility]  Dating   06/01/2022, by Last Menstrual Period  [facility] Model  [x]  Traditional  [ ]  Centering  [ ]  Mom-Baby Dyad  Anatomy US   normal  Language   English        Flu Vaccine   11/2021  Genetic/Carrier Screen   NIPS:   Low risks  AFP:   neg  Horizon: negative x 4  TDaP Vaccine    06/10/2022  Hgb A1C or   GTT  Early   Third trimester : normal  COVID [facility]   Vaginal delivery 07/06/2022    Patient Active Problem List   Diagnosis Date Noted   Vitamin D deficiency 09/11/2022    Past Surgical History:  Procedure Laterality Date   DENTAL SURGERY      OB History     Gravida  1   Para  1   Term  1   Preterm  0   AB  0   Living  1      SAB  0   IAB  0   Ectopic  0   Multiple  0   Live Births  1            Home Medications    Prior to Admission medications   Medication Sig Start Date End Date Taking? Authorizing Provider  doxycycline (VIBRAMYCIN) 100 MG capsule Take 1 capsule (100 mg total) by  mouth 2 (two) times daily. 10/26/22  Yes Tomi Bamberger, PA-C  ibuprofen (ADVIL) 200 MG tablet Take 800 mg by mouth every 6 (six) hours as needed.   Yes [provider]  medroxyPROGESTERone (DEPO-PROVERA) 150 MG/ML injection Inject 1 mL (150 mg total) into the muscle every 3 (three) months. 09/29/22  Yes Warden Fillers, MD  Prenatal MV-Min-Fe Fum-FA-DHA ([employer] PRENATAL MULTI+DHA) 27-0.8-250 MG CAPS Take 1 capsule by mouth daily.   Yes [provider]  albuterol (VENTOLIN HFA) 108 (90 Base) MCG/ACT inhaler Inhale 2 puffs into the lungs every 4 (four) hours as needed for wheezing or shortness of breath. 02/17/22   Philip Aspen, Limmie Patricia, MD  Iron, Ferrous Sulfate, 325 (65 Fe) MG TABS Take 325 mg by mouth 2 (two) times daily. 09/05/21   Philip Aspen, Limmie Patricia, MD  nystatin (MYCOSTATIN) 100000 UNIT/ML suspension  Take 5 mLs (500,000 Units total) by mouth 4 (four) times daily for 7 days 10/17/22   Garrison, [place] N, FNP  Vitamin D, Ergocalciferol, (DRISDOL) 1.25 MG (50000 UNIT) CAPS capsule Take 1 capsule (50,000 Units total) by mouth every 7 (seven) days for 12 doses. 09/11/22 11/28/22  Henderson Cloud, MD    Family History Family History  Problem Relation Age of Onset   Hypertension Mother    Colon cancer Mother    Hypertension Father    Hyperlipidemia Father    Asthma Maternal Grandmother     Social History Social History   Tobacco Use   Smoking status: Never   Smokeless tobacco: Never  Vaping Use   Vaping status: Never Used  Substance Use Topics   Alcohol use: Not Currently    Comment: social, not since confirmed pregnancy   Drug use: Never     Allergies   Shellfish allergy and Naproxen   Review of Systems Review of Systems  Constitutional:  Negative for chills and fever.  Eyes:  Negative for discharge and redness.  Respiratory:  Negative for shortness of breath.   Gastrointestinal:  Negative for abdominal pain, nausea and vomiting.   Musculoskeletal:  Negative for arthralgias.  Skin:  Positive for color change. Negative for wound.     Physical Exam Triage Vital Signs ED Triage Vitals  Encounter Vitals Group     BP 10/26/22 1321 111/65     Systolic BP Percentile --      Diastolic BP Percentile --      Pulse Rate 10/26/22 1321 85     Resp 10/26/22 1321 18     Temp 10/26/22 1321 98.2 F (36.8 C)     Temp Source 10/26/22 1321 Oral     SpO2 10/26/22 1321 96 %     Weight 10/26/22 1318 156 lb 1.4 oz (70.8 kg)     Height 10/26/22 1318 5\' 5"  (1.651 m)     Head Circumference --      Peak Flow --      Pain Score 10/26/22 1317 10     Pain Loc --      Pain Education --      Exclude from Growth Chart --    No data found.  Updated Vital Signs BP 111/65 (BP Location: Left Arm)   Pulse 85   Temp 98.2 F (36.8 C) (Oral)   Resp 18   Ht 5\' 5"  (1.651 m)   Wt 156 lb 1.4 oz (70.8 kg)   LMP  (LMP Unknown)   SpO2 96%   Breastfeeding Yes   BMI 25.97 kg/m      Physical Exam Vitals and nursing note reviewed.  Constitutional:      General: She is not in acute distress.    Appearance: Normal appearance. She is not ill-appearing.  HENT:     Head: Normocephalic and atraumatic.  Eyes:     Conjunctiva/sclera: Conjunctivae normal.  Cardiovascular:     Rate and Rhythm: Normal rate.  Pulmonary:     Effort: Pulmonary effort is normal. No respiratory distress.  Musculoskeletal:     Comments: Mild diffuse swelling to her right index finger, purulent fluid collection present lateral to right index nail, decreased range of motion of right index finger, normal range of motion of other right fingers and right wrist  Neurological:     Mental Status: She is alert.  Psychiatric:        Mood and Affect: Mood normal.  Behavior: Behavior normal.        Thought Content: Thought content normal.      UC Treatments / Results  Labs (all labs ordered are listed, but only abnormal results are displayed) Labs Reviewed - No  data to display  EKG   Radiology No results found.  Procedures Procedures (including critical care time)  Medications Ordered in UC Medications - No data to display  Initial Impression / Assessment and Plan / UC Course  I have reviewed the triage vital signs and the nursing notes.  Pertinent labs & imaging results that were available during my care of the patient were reviewed by me and considered in my medical decision making (see chart for details).    Will treat to cover cellulitis and paronychia with doxycycline.  Patient is not pregnant but states she is breast-feeding.  Recommended she pump and dump her breast milk while taking doxycycline to avoid infant exposure.  Advised warm soaks to help promote drainage.  Encouraged follow-up if no gradual improvement or with any further concerns.  Patient requested finger splint which was applied in office to help prevent further pain by hitting her finger at work.  Final Clinical Impressions(s) / UC Diagnoses   Final diagnoses:  Cellulitis of right index finger  Paronychia of right index finger   Discharge Instructions   None    ED Prescriptions     Medication Sig Dispense Auth. Provider   doxycycline (VIBRAMYCIN) 100 MG capsule Take 1 capsule (100 mg total) by mouth 2 (two) times daily. 20 capsule Tomi Bamberger, PA-C      PDMP not reviewed this encounter.   Tomi Bamberger, PA-C 10/26/22 1407    Tomi Bamberger, PA-C 10/26/22 1408    Tomi Bamberger, PA-C 10/26/22 1409

## 2022-10-28 ENCOUNTER — Ambulatory Visit: Payer: Commercial Managed Care - PPO | Admitting: Internal Medicine

## 2022-11-07 ENCOUNTER — Ambulatory Visit: Payer: Commercial Managed Care - PPO | Admitting: Family Medicine

## 2022-11-07 ENCOUNTER — Ambulatory Visit (INDEPENDENT_AMBULATORY_CARE_PROVIDER_SITE_OTHER): Payer: Commercial Managed Care - PPO

## 2022-11-07 ENCOUNTER — Encounter: Payer: Self-pay | Admitting: Family Medicine

## 2022-11-07 VITALS — BP 108/80 | HR 97 | Temp 99.0°F | Wt 153.8 lb

## 2022-11-07 DIAGNOSIS — T50905A Adverse effect of unspecified drugs, medicaments and biological substances, initial encounter: Secondary | ICD-10-CM

## 2022-11-07 DIAGNOSIS — L26 Exfoliative dermatitis: Secondary | ICD-10-CM

## 2022-11-07 DIAGNOSIS — L03011 Cellulitis of right finger: Secondary | ICD-10-CM | POA: Diagnosis not present

## 2022-11-07 DIAGNOSIS — S61309A Unspecified open wound of unspecified finger with damage to nail, initial encounter: Secondary | ICD-10-CM | POA: Diagnosis not present

## 2022-11-07 DIAGNOSIS — L0390 Cellulitis, unspecified: Secondary | ICD-10-CM | POA: Diagnosis not present

## 2022-11-07 NOTE — Progress Notes (Signed)
Established Patient Office Visit   Subjective  Patient ID: Amanda Ortega, female    DOB: 02/07/1991  Age: 32 y.o. MRN: [id number]  Chief Complaint  Patient presents with   Nail Problem    Right index fingernail detatched after it had turned green and swollen, then nurse popped it and felt immediate relief, was taking doxycycline. Not sure if hands and feet are peeling due to reaction of medication or something. Hands are getting better, but feet are not, states they are sensitive.     Patient is a 32 year old female followed by Dr. Ardyth Harps and seen for ongoing concern.  Patient seen at [facility] on 10/26/2022 for right index finger pain.  Patient with paronychia that was drained.  Started on doxycycline x 10 days.  As patient is breast-feeding advised to pump and dump.  Patient notes pain has resolved but fingernail came off yesterday.  Patient also notes peeling of hands and feet that started several days ago.  Patient had never had doxycycline before.    Past Medical History:  Diagnosis Date   Anemia    Asthma    Eczema    Encounter for supervision of normal first pregnancy, third trimester 12/05/2021          Nursing Staff  Provider  Office Location  [facility]  Dating   06/01/2022, by Last Menstrual Period  [facility] Model  [x]  Traditional  [ ]  Centering  [ ]  Mom-Baby Dyad  Anatomy US   normal  Language   English        Flu Vaccine   11/2021  Genetic/Carrier Screen   NIPS:   Low risks  AFP:   neg  Horizon: negative x 4  TDaP Vaccine    06/10/2022  Hgb A1C or   GTT  Early   Third trimester : normal  COVID Va   Vaginal delivery 07/06/2022   Past Surgical History:  Procedure Laterality Date   DENTAL SURGERY     Social History   Tobacco Use   Smoking status: Never   Smokeless tobacco: Never  Vaping Use   Vaping status: Never Used  Substance Use Topics   Alcohol use: Not Currently    Comment: social, not since confirmed pregnancy   Drug use: Never   Family History  Problem Relation Age of Onset    Hypertension Mother    Colon cancer Mother    Hypertension Father    Hyperlipidemia Father    Asthma Maternal Grandmother    Allergies  Allergen Reactions   Shellfish Allergy Swelling    Lip swelling    Naproxen Hives and Swelling    Tongue swelling   Doxycycline Dermatitis    Exfoliative dermatitis of hands and feet.      ROS Negative unless stated above    Objective:     BP 108/80 (BP Location: Right Arm, Patient Position: Sitting, Cuff Size: Normal)   Pulse 97   Temp 99 F (37.2 C) (Oral)   Wt 153 lb 12.8 oz (69.8 kg)   LMP  (LMP Unknown)   SpO2 96%   BMI 25.59 kg/m    Physical Exam Constitutional:      General: She is not in acute distress.    Appearance: Normal appearance.  HENT:     Head: Normocephalic and atraumatic.     Nose: Nose normal.     Mouth/Throat:     Mouth: Mucous membranes are moist.  Cardiovascular:     Rate and Rhythm: Normal rate and  regular rhythm.  Pulmonary:     Effort: Pulmonary effort is normal.  Musculoskeletal:        General: Normal range of motion.  Skin:    General: Skin is warm and dry.     Comments: R 2nd digit with partial fingernail avulsion.  Proximal nail bed remaining.  No drainage, erythema, increased warmth.  Neurological:     Mental Status: She is alert and oriented to person, place, and time.      No results found for any visits on 11/07/22.    Assessment & Plan:  Partial avulsion of fingernail, initial encounter  Paronychia of right index finger -     DG Finger Index Right; Future  Exfoliative dermatitis  Adverse effect of drug, initial encounter  Partial nail avulsion  x 1 d status post paronychia infection.  Infection appears to be resolved after 10-day doxycycline course however patient developed exfoliative dermatitis.  Medication added to allergy list.  Discussed obtaining x-ray to evaluate for osteo though less likely.  Placed referral to hand surgery for follow-up.  Discussed supportive care.   Keep finger clean and dry.  Return if symptoms worsen or fail to improve.   Deeann Saint, MD

## 2022-11-25 ENCOUNTER — Telehealth: Payer: Commercial Managed Care - PPO | Admitting: Family Medicine

## 2022-11-25 DIAGNOSIS — L309 Dermatitis, unspecified: Secondary | ICD-10-CM

## 2022-11-25 MED ORDER — TRIAMCINOLONE ACETONIDE 0.025 % EX OINT
1.0000 | TOPICAL_OINTMENT | Freq: Two times a day (BID) | CUTANEOUS | 0 refills | Status: DC
Start: 2022-11-25 — End: 2023-05-18

## 2022-11-25 NOTE — Progress Notes (Signed)

## 2022-11-28 DIAGNOSIS — L608 Other nail disorders: Secondary | ICD-10-CM | POA: Diagnosis not present

## 2022-12-15 ENCOUNTER — Other Ambulatory Visit (INDEPENDENT_AMBULATORY_CARE_PROVIDER_SITE_OTHER): Payer: Commercial Managed Care - PPO

## 2022-12-15 DIAGNOSIS — E559 Vitamin D deficiency, unspecified: Secondary | ICD-10-CM | POA: Diagnosis not present

## 2022-12-15 LAB — VITAMIN D 25 HYDROXY (VIT D DEFICIENCY, FRACTURES): VITD: 21.66 ng/mL — ABNORMAL LOW (ref 30.00–100.00)

## 2022-12-16 ENCOUNTER — Ambulatory Visit: Payer: Commercial Managed Care - PPO | Admitting: Emergency Medicine

## 2022-12-16 VITALS — BP 105/72 | HR 94 | Wt 156.0 lb

## 2022-12-16 DIAGNOSIS — Z3042 Encounter for surveillance of injectable contraceptive: Secondary | ICD-10-CM

## 2022-12-16 MED ORDER — MEDROXYPROGESTERONE ACETATE 150 MG/ML IM SUSP
150.0000 mg | Freq: Once | INTRAMUSCULAR | Status: AC
  Administered 2022-12-16: 150 mg via INTRAMUSCULAR

## 2022-12-16 NOTE — Progress Notes (Signed)
Date last pap: 08/29/2021. Last Depo-Provera: 09/29/2022. Side Effects if any: Breakthrough Bleeding. Serum HCG indicated? NA. Depo-Provera 150 mg IM given by: Resa Miner, RN into [facility] Next appointment due Dec 10-Dec 24.

## 2023-01-01 ENCOUNTER — Encounter: Payer: Self-pay | Admitting: Obstetrics and Gynecology

## 2023-01-01 DIAGNOSIS — O9270 Unspecified disorders of lactation: Secondary | ICD-10-CM

## 2023-01-03 MED ORDER — METOCLOPRAMIDE HCL 10 MG PO TABS
10.0000 mg | ORAL_TABLET | Freq: Four times a day (QID) | ORAL | 1 refills | Status: DC
Start: 2023-01-03 — End: 2023-05-18

## 2023-01-05 ENCOUNTER — Telehealth: Payer: Self-pay | Admitting: *Deleted

## 2023-01-05 NOTE — Telephone Encounter (Signed)
Patient left a voicemail message this afternoon stating she has an appointment with Lactation and was prescribed Reglan . She wants to know if she can take that with her lactation supplement. She states she messaged her doctors office and they said check with yall. Wants to know if they are contraindicated and ask to please not give her the run around. ( She is patient of [facility]) Nancy Fetter

## 2023-01-06 ENCOUNTER — Ambulatory Visit: Payer: Commercial Managed Care - PPO | Admitting: Family Medicine

## 2023-01-06 ENCOUNTER — Ambulatory Visit (INDEPENDENT_AMBULATORY_CARE_PROVIDER_SITE_OTHER): Payer: Commercial Managed Care - PPO

## 2023-01-06 DIAGNOSIS — Z111 Encounter for screening for respiratory tuberculosis: Secondary | ICD-10-CM

## 2023-01-06 NOTE — Telephone Encounter (Signed)
Called patient stating I am returning her phone call & asked what lactation supplement she was taking. Patient states she is taking legendairy milk's pump princess supplement. Told patient she could continue taking both. Patient verbalized understanding and asked if depo could decrease her supply. Told patient it isn't supposed to since it is progesterone only unlike birth control that has estrogen in it which could. Patient verbalized understanding.

## 2023-01-06 NOTE — Progress Notes (Signed)
PPD Placement note Sandria Senter, 32 y.o. female is here today for placement of PPD test Reason for PPD test: work Pt taken PPD test before: yes Verified in allergy area and with patient that they are not allergic to the products PPD is made of (Phenol or Tween). No:  Is patient taking any oral or IV steroid medication now or have they taken it in the last month? no Has the patient ever received the BCG vaccine?: yes Has the patient been in recent contact with anyone known or suspected of having active TB disease?: no      Date of exposure (if applicable): NA      Name of person they were exposed to (if applicable): NA Patient's Country of origin?: [place] O: Alert and oriented in NAD. P:  PPD placed on 01/06/2023.  Patient advised to return for reading within 48-72 hours.

## 2023-01-09 ENCOUNTER — Ambulatory Visit: Payer: Commercial Managed Care - PPO | Admitting: *Deleted

## 2023-01-09 DIAGNOSIS — Z111 Encounter for screening for respiratory tuberculosis: Secondary | ICD-10-CM | POA: Diagnosis not present

## 2023-01-09 LAB — TB SKIN TEST
Induration: 0 mm
TB Skin Test: NEGATIVE

## 2023-01-09 NOTE — Progress Notes (Signed)
PPD Reading Note  PPD read and results entered in EpicCare.  Result: 0 mm induration.  Interpretation: negative  If test not read within 48-72 hours of initial placement, patient advised to repeat in other arm 1-3 weeks after this test.  Allergic reaction: no

## 2023-03-03 ENCOUNTER — Ambulatory Visit (INDEPENDENT_AMBULATORY_CARE_PROVIDER_SITE_OTHER): Payer: Commercial Managed Care - PPO | Admitting: *Deleted

## 2023-03-03 DIAGNOSIS — Z3042 Encounter for surveillance of injectable contraceptive: Secondary | ICD-10-CM

## 2023-03-03 MED ORDER — MEDROXYPROGESTERONE ACETATE 150 MG/ML IM SUSP
150.0000 mg | Freq: Once | INTRAMUSCULAR | Status: AC
  Administered 2023-03-03: 150 mg via INTRAMUSCULAR

## 2023-03-03 NOTE — Progress Notes (Signed)
Date last pap: 08/29/21. Last Depo-Provera: 12/16/22. Side Effects if any: none. Serum HCG indicated? N/A. Depo-Provera 150 mg IM given by: S.Malen Gauze, CMA.  Next appointment due 2/25-3/11/25.  Administrations This Visit     medroxyPROGESTERone (DEPO-PROVERA) injection 150 mg     Admin Date 03/03/2023 Action Given Dose 150 mg Route Intramuscular Documented By Lanney Gins, CMA

## 2023-03-04 ENCOUNTER — Ambulatory Visit: Payer: Commercial Managed Care - PPO

## 2023-05-18 ENCOUNTER — Encounter: Payer: Self-pay | Admitting: Nurse Practitioner

## 2023-05-18 ENCOUNTER — Ambulatory Visit: Payer: 59 | Admitting: General Practice

## 2023-05-18 ENCOUNTER — Ambulatory Visit (INDEPENDENT_AMBULATORY_CARE_PROVIDER_SITE_OTHER): Payer: 59 | Admitting: Nurse Practitioner

## 2023-05-18 VITALS — BP 100/68 | HR 94 | Ht 64.75 in | Wt 164.0 lb

## 2023-05-18 DIAGNOSIS — Z01419 Encounter for gynecological examination (general) (routine) without abnormal findings: Secondary | ICD-10-CM

## 2023-05-18 DIAGNOSIS — Z113 Encounter for screening for infections with a predominantly sexual mode of transmission: Secondary | ICD-10-CM | POA: Diagnosis not present

## 2023-05-18 DIAGNOSIS — E559 Vitamin D deficiency, unspecified: Secondary | ICD-10-CM | POA: Diagnosis not present

## 2023-05-18 NOTE — Progress Notes (Signed)
 Date last pap: 08-29-21. Last Depo-Provera: 03-03-23.  Unable to give Depo today because pt did not bring medication after picking it up from pharmacy which would cause issues with billing. Pt reschedule Depo injection for 05-28-23. She will still be in the window to administer Depo at that time.

## 2023-05-18 NOTE — Progress Notes (Signed)
   Amanda Ortega October 09, 1990 [id number]   History:  33 y.o. G1P1001 presents for annual exam. Depo. Normal pap history. History of anemia secondary to heavy menses, taking OTC iron supplement. Complains of fatigue. Would like Vit D checked, finished high dose supplement 2 weeks ago. PHQ-2.   Gynecologic History No LMP recorded. Patient has had an injection.   Contraception/Family planning: Depo-Provera injections Sexually active: Yes, would like STD screening  Health Maintenance Last Pap: 08/29/2021. Results were: Normal Last mammogram: Not indicated Last colonoscopy: Not indicated Last Dexa: Not indicated  Past medical history, past surgical history, family history and social history were all reviewed and documented in the EPIC chart. Boyfriend. 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 Plans for nursing school. 11 mo daughter. Mother diagnosed with colon cancer at age 50, stage 3.   ROS:  A ROS was performed and pertinent positives and negatives are included.  Exam:  Vitals:   05/18/23 1137  BP: 100/68  Pulse: 94  SpO2: 97%  Weight: 164 lb (74.4 kg)  Height: 5' 4.75" (1.645 m)     Body mass index is 27.5 kg/m.  General appearance:  Normal Thyroid:  Symmetrical, normal in size, without palpable masses or nodularity. Respiratory  Auscultation:  Clear without wheezing or rhonchi Cardiovascular  Auscultation:  Regular rate, without rubs, murmurs or gallops  Edema/varicosities:  Not grossly evident Abdominal  Soft,nontender, without masses, guarding or rebound.  Liver/spleen:  No organomegaly noted  Hernia:  None appreciated  Skin  Inspection:  Grossly normal Breasts: Examined lying and sitting.   Right: Without masses, retractions, nipple discharge or axillary adenopathy.   Left: Without masses, retractions, nipple discharge or axillary adenopathy. Pelvic: External genitalia:  no lesions              Urethra:  normal appearing urethra with no masses, tenderness or lesions               Bartholins and Skenes: normal                 Vagina: normal appearing vagina with normal color and discharge, no lesions              Cervix: no lesions Bimanual Exam:  Uterus:  no masses or tenderness              Adnexa: no mass, fullness, tenderness              Rectovaginal: Deferred              Anus:  normal, no lesions  Patient informed chaperone available to be present for breast and pelvic exam. Patient has requested no chaperone to be present. Patient has been advised what will be completed during breast and pelvic exam.   Assessment/Plan:  33 y.o. G1P1001 for annual exam.   Well female exam with routine gynecological exam - Education provided on SBEs, importance of preventative screenings, current guidelines, high calcium diet, regular exercise, and multivitamin daily. Labs with PCP.   Screening examination for STD (sexually transmitted disease) - Plan: SURESWAB CT/NG/T. vaginalis, RPR, HIV Antibody (routine testing w rflx)  Vitamin D deficiency - Plan: VITAMIN D 25 Hydroxy (Vit-D Deficiency, Fractures)  Screening for cervical cancer - Normal pap history. Will repeat at 3-year interval per guidelines.   Return in about 1 year (around 05/17/2024) for Annual.   Olivia Mackie DNP, 12:11 PM 05/18/2023

## 2023-05-19 ENCOUNTER — Ambulatory Visit: Payer: Commercial Managed Care - PPO

## 2023-05-19 LAB — HIV ANTIBODY (ROUTINE TESTING W REFLEX): HIV 1&2 Ab, 4th Generation: NONREACTIVE

## 2023-05-19 LAB — SURESWAB CT/NG/T. VAGINALIS
C. trachomatis RNA, TMA: NOT DETECTED
N. gonorrhoeae RNA, TMA: NOT DETECTED
Trichomonas vaginalis RNA: NOT DETECTED

## 2023-05-19 LAB — VITAMIN D 25 HYDROXY (VIT D DEFICIENCY, FRACTURES): Vit D, 25-Hydroxy: 23 ng/mL — ABNORMAL LOW (ref 30–100)

## 2023-05-19 LAB — RPR: RPR Ser Ql: NONREACTIVE

## 2023-05-20 ENCOUNTER — Encounter: Payer: Self-pay | Admitting: Nurse Practitioner

## 2023-05-20 ENCOUNTER — Other Ambulatory Visit: Payer: Self-pay | Admitting: Nurse Practitioner

## 2023-05-20 DIAGNOSIS — E559 Vitamin D deficiency, unspecified: Secondary | ICD-10-CM

## 2023-05-20 MED ORDER — VITAMIN D (ERGOCALCIFEROL) 1.25 MG (50000 UNIT) PO CAPS: 50000.0000 [IU] | ORAL_CAPSULE | ORAL | 1 refills | Status: DC

## 2023-05-28 ENCOUNTER — Ambulatory Visit: Payer: 59

## 2023-05-28 DIAGNOSIS — Z3042 Encounter for surveillance of injectable contraceptive: Secondary | ICD-10-CM

## 2023-05-28 MED ORDER — MEDROXYPROGESTERONE ACETATE 150 MG/ML IM SUSP
150.0000 mg | Freq: Once | INTRAMUSCULAR | Status: AC
  Administered 2023-05-28: 150 mg via INTRAMUSCULAR

## 2023-05-28 NOTE — Progress Notes (Signed)
 Date last pap: 08/29/21. Last Depo-Provera: 03/03/23. Side Effects if any: NA. Serum HCG indicated? NA. Depo-Provera 150 mg IM given by: Karma Ganja, RN. Next appointment due May 22nd - June 5th 2025.

## 2023-06-01 ENCOUNTER — Encounter: Payer: Self-pay | Admitting: Nurse Practitioner

## 2023-06-07 ENCOUNTER — Telehealth: Admitting: Family Medicine

## 2023-06-07 DIAGNOSIS — R3989 Other symptoms and signs involving the genitourinary system: Secondary | ICD-10-CM

## 2023-06-08 MED ORDER — CEPHALEXIN 500 MG PO CAPS: 500.0000 mg | ORAL_CAPSULE | Freq: Two times a day (BID) | ORAL | 0 refills | Status: AC

## 2023-06-08 NOTE — Progress Notes (Signed)

## 2023-06-08 NOTE — Telephone Encounter (Signed)
 See MyChart encounter dated 06/01/23.   Encounter closed.

## 2023-06-29 ENCOUNTER — Ambulatory Visit (INDEPENDENT_AMBULATORY_CARE_PROVIDER_SITE_OTHER): Admitting: Family Medicine

## 2023-06-29 VITALS — BP 106/76 | HR 97 | Temp 98.4°F | Wt 165.8 lb

## 2023-06-29 DIAGNOSIS — R300 Dysuria: Secondary | ICD-10-CM | POA: Diagnosis not present

## 2023-06-29 LAB — POC URINALSYSI DIPSTICK (AUTOMATED)
Bilirubin, UA: NEGATIVE
Blood, UA: POSITIVE
Glucose, UA: NEGATIVE
Ketones, UA: NEGATIVE
Leukocytes, UA: NEGATIVE
Nitrite, UA: NEGATIVE
Protein, UA: NEGATIVE
Spec Grav, UA: >=1.03 — AB (ref 1.010–1.025)
Urobilinogen, UA: 0.2 U/dL
pH, UA: 6 (ref 5.0–8.0)

## 2023-06-29 NOTE — Progress Notes (Signed)
 Established Patient Office Visit  Subjective   Patient ID: Wilburta Milbourn, female    DOB: 04-Jan-1991  Age: 33 y.o. MRN: [id number]  No chief complaint on file.   HPI   Palmira is seen with some urine frequency and mild dysuria past couple days.  Back in March she was actually treated on e-visit for possible UTI with Keflex and symptoms did improve.  She has had little bit of vaginal spotting recently but no gross hematuria.  No flank pain.  No fevers or chills.  No nausea or vomiting.  She has 1 steady partner and she has low risk for STD.  Denies any vaginal discharge.  She is still breast-feeding.  Past Medical History:  Diagnosis Date   Anemia    Asthma    Eczema    Encounter for supervision of normal first pregnancy, third trimester 12/05/2021          Nursing Staff  Provider  Office Location  [facility]  Dating   06/01/2022, by Last Menstrual Period  [facility] Model  [x]  Traditional  [ ]  Centering  [ ]  Mom-Baby Dyad  Anatomy US   normal  Language   English        Flu Vaccine   11/2021  Genetic/Carrier Screen   NIPS:   Low risks  AFP:   neg  Horizon: negative x 4  TDaP Vaccine    06/10/2022  Hgb A1C or   GTT  Early   Third trimester : normal  COVID [facility]   Vaginal delivery 07/06/2022   Past Surgical History:  Procedure Laterality Date   DENTAL SURGERY      reports that she has never smoked. She has never used smokeless tobacco. She reports that she does not currently use alcohol. She reports that she does not use drugs. family history includes Asthma in her maternal grandmother; Colon cancer in her mother; Hyperlipidemia in her father; Hypertension in her father and mother. Allergies  Allergen Reactions   Shellfish Allergy Swelling    Lip swelling    Naproxen Hives and Swelling    Tongue swelling   Doxycycline Dermatitis    Exfoliative dermatitis of hands and feet.    Review of Systems  Constitutional:  Negative for chills and fever.  Genitourinary:  Positive for dysuria and frequency.  Negative for flank pain.      Objective:     BP 106/76 (BP Location: Right Arm, Patient Position: Sitting, Cuff Size: Normal)   Pulse 97   Temp 98.4 F (36.9 C) (Oral)   Wt 165 lb 12.8 oz (75.2 kg)   SpO2 97%   BMI 27.59 kg/m  BP Readings from Last 3 Encounters:  06/29/23 106/76  05/18/23 107/67  05/18/23 100/68   Wt Readings from Last 3 Encounters:  06/29/23 165 lb 12.8 oz (75.2 kg)  05/18/23 164 lb 11.2 oz (74.7 kg)  05/18/23 164 lb (74.4 kg)      Physical Exam Vitals reviewed.  Constitutional:      General: She is not in acute distress.    Appearance: She is not ill-appearing.  Cardiovascular:     Rate and Rhythm: Normal rate and regular rhythm.  Pulmonary:     Effort: Pulmonary effort is normal.     Breath sounds: Normal breath sounds.  Neurological:     Mental Status: She is alert.      Results for orders placed or performed in visit on 06/29/23  POCT Urinalysis Dipstick (Automated)  Result Value Ref Range  Color, UA Yellow    Clarity, UA Clear    Glucose, UA Negative Negative   Bilirubin, UA Negative    Ketones, UA Negative    Spec Grav, UA >=1.030 (A) 1.010 - 1.025   Blood, UA Positive    pH, UA 6.0 5.0 - 8.0   Protein, UA Negative Negative   Urobilinogen, UA 0.2 0.2 or 1.0 E.U./dL   Nitrite, UA Negative    Leukocytes, UA Negative Negative      The ASCVD Risk score (Arnett DK, et al., 2019) failed to calculate for the following reasons:   The 2019 ASCVD risk score is only valid for ages 49 to 82    Assessment & Plan:   Problem List Items Addressed This Visit   None Visit Diagnoses       Dysuria    -  Primary   Relevant Orders   POCT Urinalysis Dipstick (Automated) (Completed)   Urine Culture     Patient presents with 2-day history of some mild dysuria and frequency.  Low clinical suspicion for UTI.  She does have some blood on urine dipstick but negative nitrites and leukocytes.  She had very similar symptoms a month ago but never  cultured.  Will send culture to be safe but no indication for antibiotics at this time.  We did mention issue of possible STD screening as well but she feels like her risk is very low and declines this time.  She does have positive blood on dipstick but states she has had just a little bit of vaginal spotting past couple days and is due for her period anytime now.  No follow-ups on file.    Evelena Peat, MD

## 2023-06-30 LAB — URINE CULTURE
MICRO NUMBER:: 16296960
SPECIMEN QUALITY:: ADEQUATE

## 2023-07-14 ENCOUNTER — Telehealth: Admitting: Physician Assistant

## 2023-07-14 DIAGNOSIS — R21 Rash and other nonspecific skin eruption: Secondary | ICD-10-CM | POA: Diagnosis not present

## 2023-07-14 MED ORDER — TRIAMCINOLONE ACETONIDE 0.1 % EX CREA: 1.0000 | TOPICAL_CREAM | Freq: Two times a day (BID) | CUTANEOUS | 0 refills | Status: DC

## 2023-07-14 NOTE — Progress Notes (Signed)
 I have spent 5 minutes in review of e-visit questionnaire, review and updating patient chart, medical decision making and response to patient.   Piedad Climes, PA-C

## 2023-07-14 NOTE — Progress Notes (Signed)
 E Visit for Rash  We are sorry that you are not feeling well. Here is how we plan to help!  It seems you have an irritant dermatitis present. I recommend keeping the skin clean, cool and dry. I am prescribing triamcinolone  cream to apply twice daily for up to 14 days. You can also use an OTC antihistamine like claritin to further help with itch, if needed.  Please schedule a follow-up with your PCP for any non-resolving symptoms despite treatment.   HOME CARE:  Take cool showers and avoid direct sunlight. Apply cool compress or wet dressings. Take a bath in an oatmeal bath.  Sprinkle content of one Aveeno packet under running faucet with comfortably warm water.  Bathe for 15-20 minutes, 1-2 times daily.  Pat dry with a towel. Do not rub the rash. Use hydrocortisone cream. Take an antihistamine like Benadryl  for widespread rashes that itch.  The adult dose of Benadryl  is 25-50 mg by mouth 4 times daily. Caution:  This type of medication may cause sleepiness.  Do not drink alcohol, drive, or operate dangerous machinery while taking antihistamines.  Do not take these medications if you have prostate enlargement.  Read package instructions thoroughly on all medications that you take.  GET HELP RIGHT AWAY IF:  Symptoms don't go away after treatment. Severe itching that persists. If you rash spreads or swells. If you rash begins to smell. If it blisters and opens or develops a yellow-brown crust. You develop a fever. You have a sore throat. You become short of breath.  MAKE SURE YOU:  Understand these instructions. Will watch your condition. Will get help right away if you are not doing well or get worse.  Thank you for choosing an e-visit.  Your e-visit answers were reviewed by a board certified advanced clinical practitioner to complete your personal care plan. Depending upon the condition, your plan could have included both over the counter or prescription medications.  Please review  your pharmacy choice. Make sure the pharmacy is open so you can pick up prescription now. If there is a problem, you may contact your provider through [employer] and have the prescription routed to another pharmacy.  Your safety is important to us . If you have drug allergies check your prescription carefully.   For the next 24 hours you can use MyChart to ask questions about today's visit, request a non-urgent call back, or ask for a work or school excuse. You will get an email in the next two days asking about your experience. I hope that your e-visit has been valuable and will speed your recovery.

## 2023-07-14 NOTE — Progress Notes (Signed)
 Message sent to patient requesting further input regarding current symptoms. Awaiting patient response.

## 2023-08-19 ENCOUNTER — Ambulatory Visit

## 2023-08-19 DIAGNOSIS — Z3042 Encounter for surveillance of injectable contraceptive: Secondary | ICD-10-CM

## 2023-08-19 MED ORDER — MEDROXYPROGESTERONE ACETATE 150 MG/ML IM SUSP
150.0000 mg | Freq: Once | INTRAMUSCULAR | Status: AC
  Administered 2023-08-19: 150 mg via INTRAMUSCULAR

## 2023-08-19 NOTE — Progress Notes (Signed)
 Date last pap: 08/29/21. Last Depo-Provera : 05/28/23. Side Effects if any: NA. Serum HCG indicated? NA. Depo-Provera  150 mg IM given by: Artemio Bilberry, RN. Next appointment due August 13 - 27 2025.

## 2023-11-04 ENCOUNTER — Other Ambulatory Visit: Payer: Self-pay | Admitting: Obstetrics and Gynecology

## 2023-11-04 ENCOUNTER — Other Ambulatory Visit: Payer: Self-pay

## 2023-11-04 MED ORDER — MEDROXYPROGESTERONE ACETATE 150 MG/ML IM SUSP
150.0000 mg | INTRAMUSCULAR | 0 refills | Status: DC
Start: 2023-11-04 — End: 2024-01-12

## 2023-11-05 ENCOUNTER — Ambulatory Visit

## 2023-11-05 VITALS — BP 102/68 | HR 90

## 2023-11-05 DIAGNOSIS — Z3042 Encounter for surveillance of injectable contraceptive: Secondary | ICD-10-CM | POA: Diagnosis not present

## 2023-11-05 MED ORDER — MEDROXYPROGESTERONE ACETATE 150 MG/ML IM SUSP
150.0000 mg | INTRAMUSCULAR | Status: AC
  Administered 2023-11-05: 150 mg via INTRAMUSCULAR

## 2023-11-05 NOTE — Progress Notes (Signed)
 Pt is in the office for depo injection. Administered in [place] and pt tolerated well.  Next due Oct 30- Nov 13. Pt states she is going [place] during the next depo window and may need to come 2 days early on Oct 28th. .. Administrations This Visit     medroxyPROGESTERone  (DEPO-PROVERA ) injection 150 mg     Admin Date 11/05/2023 Action Given Dose 150 mg Route Intramuscular Documented By Doneta Laymon BIRCH, RN

## 2023-12-17 ENCOUNTER — Ambulatory Visit: Payer: Self-pay | Admitting: Internal Medicine

## 2023-12-17 ENCOUNTER — Ambulatory Visit: Admitting: Internal Medicine

## 2023-12-17 VITALS — BP 110/70 | HR 100 | Temp 98.8°F | Wt 168.1 lb

## 2023-12-17 DIAGNOSIS — R5383 Other fatigue: Secondary | ICD-10-CM | POA: Diagnosis not present

## 2023-12-17 DIAGNOSIS — E559 Vitamin D deficiency, unspecified: Secondary | ICD-10-CM

## 2023-12-17 LAB — COMPREHENSIVE METABOLIC PANEL WITH GFR
ALT: 23 U/L (ref 0–35)
AST: 25 U/L (ref 0–37)
Albumin: 4.5 g/dL (ref 3.5–5.2)
Alkaline Phosphatase: 67 U/L (ref 39–117)
BUN: 16 mg/dL (ref 6–23)
CO2: 25 meq/L (ref 19–32)
Calcium: 9.8 mg/dL (ref 8.4–10.5)
Chloride: 107 meq/L (ref 96–112)
Creatinine, Ser: 0.75 mg/dL (ref 0.40–1.20)
GFR: 106.47 mL/min (ref >60.00)
Glucose, Bld: 76 mg/dL (ref 70–99)
Potassium: 3.7 meq/L (ref 3.5–5.1)
Sodium: 139 meq/L (ref 135–145)
Total Bilirubin: 0.5 mg/dL (ref 0.2–1.2)
Total Protein: 7.6 g/dL (ref 6.0–8.3)

## 2023-12-17 LAB — CBC WITH DIFFERENTIAL/PLATELET
Basophils Absolute: 0 K/uL (ref 0.0–0.1)
Basophils Relative: 0.6 % (ref 0.0–3.0)
Eosinophils Absolute: 0.2 K/uL (ref 0.0–0.7)
Eosinophils Relative: 3.3 % (ref 0.0–5.0)
HCT: 38.6 % (ref 36.0–46.0)
Hemoglobin: 12.8 g/dL (ref 12.0–15.0)
Lymphocytes Relative: 27.7 % (ref 12.0–46.0)
Lymphs Abs: 1.9 K/uL (ref 0.7–4.0)
MCHC: 33.1 g/dL (ref 30.0–36.0)
MCV: 95.3 fl (ref 78.0–100.0)
Monocytes Absolute: 0.8 K/uL (ref 0.1–1.0)
Monocytes Relative: 10.8 % (ref 3.0–12.0)
Neutro Abs: 4 K/uL (ref 1.4–7.7)
Neutrophils Relative %: 57.6 % (ref 43.0–77.0)
Platelets: 225 K/uL (ref 150.0–400.0)
RBC: 4.05 Mil/uL (ref 3.87–5.11)
RDW: 12.2 % (ref 11.5–15.5)
WBC: 7 K/uL (ref 4.0–10.5)

## 2023-12-17 LAB — IBC + FERRITIN
Ferritin: 31 ng/mL (ref 10.0–291.0)
Iron: 73 ug/dL (ref 42–145)
Saturation Ratios: 19.5 % — ABNORMAL LOW (ref 20.0–50.0)
TIBC: 373.8 ug/dL (ref 250.0–450.0)
Transferrin: 267 mg/dL (ref 212.0–360.0)

## 2023-12-17 LAB — VITAMIN D 25 HYDROXY (VIT D DEFICIENCY, FRACTURES): VITD: 30.5 ng/mL (ref 30.00–100.00)

## 2023-12-17 LAB — TSH: TSH: 1.04 u[IU]/mL (ref 0.35–5.50)

## 2023-12-17 LAB — VITAMIN B12: Vitamin B-12: 476 pg/mL (ref 211–911)

## 2023-12-17 MED ORDER — ALBUTEROL SULFATE HFA 108 (90 BASE) MCG/ACT IN AERS: 2.0000 | INHALATION_SPRAY | RESPIRATORY_TRACT | 2 refills | Status: AC | PRN

## 2023-12-17 NOTE — Progress Notes (Signed)
 Established Patient Office Visit     CC/Reason for Visit: Fatigue  HPI: Amanda Ortega is a 33 y.o. female who is coming in today for the above mentioned reasons. Past Medical History is significant for: Vitamin D  deficiency.  She has been feeling well, other than very fatigued.  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  She sleeps well 6 to 8 hours every night.  Does not feel depressed.   Past Medical/Surgical History: Past Medical History:  Diagnosis Date   Anemia    Asthma    Eczema    Encounter for supervision of normal first pregnancy, third trimester 12/05/2021          Nursing Staff  Provider  Office Location  [facility]  Dating   06/01/2022, by Last Menstrual Period  [facility] Model  [x]  Traditional  [ ]  Centering  [ ]  Mom-Baby Dyad  Anatomy US    normal  Language   English        Flu Vaccine   11/2021  Genetic/Carrier Screen   NIPS:   Low risks  AFP:   neg  Horizon: negative x 4  TDaP Vaccine    06/10/2022  Hgb A1C or   GTT  Early   Third trimester : normal  COVID [facility]   Vaginal delivery 07/06/2022    Past Surgical History:  Procedure Laterality Date   DENTAL SURGERY      Social History:  reports that she has never smoked. She has never used smokeless tobacco. She reports that she does not currently use alcohol. She reports that she does not use drugs.  Allergies: Allergies  Allergen Reactions   Shellfish Allergy Swelling    Lip swelling    Naproxen Hives and Swelling    Tongue swelling   Doxycycline  Dermatitis    Exfoliative dermatitis of hands and feet.    Family History:  Family History  Problem Relation Age of Onset   Hypertension Mother    Colon cancer Mother    Hypertension Father    Hyperlipidemia Father    Asthma Maternal Grandmother      Current Outpatient Medications:    medroxyPROGESTERone  (DEPO-PROVERA ) 150 MG/ML injection, Inject 1 mL (150 mg total) into the muscle every 3  (three) months., Disp: 1 mL, Rfl: 0   Prenatal MV-Min-Fe Fum-FA-DHA ([facility] PRENATAL MULTI+DHA) 27-0.8-250 MG CAPS, Take 1 capsule by mouth daily., Disp: , Rfl:    albuterol  (VENTOLIN  HFA) 108 (90 Base) MCG/ACT inhaler, Inhale 2 puffs into the lungs every 4 (four) hours as needed for wheezing or shortness of breath., Disp: 1 each, Rfl: 2   ferrous sulfate  325 (65 FE) MG tablet, Take 325 mg by mouth daily with breakfast. (Patient not taking: Reported on 12/17/2023), Disp: , Rfl:   Current Facility-Administered Medications:    medroxyPROGESTERone  (DEPO-PROVERA ) injection 150 mg, 150 mg, Intramuscular, Q90 days, Abigail Rollo DASEN, MD, 150 mg at 11/05/23 1025  Review of Systems:  Negative unless indicated in HPI.   Physical Exam: Vitals:   12/17/23 0812  BP: 110/70  Pulse: 100  Temp: 98.8 F (37.1 C)  TempSrc: Oral  SpO2: 98%  Weight: 168 lb 1.6 oz (76.2 kg)    Body mass index is 27.97 kg/m.   Physical Exam Vitals reviewed.  Constitutional:      Appearance: Normal appearance.  HENT:     Head: Normocephalic and atraumatic.  Eyes:  Conjunctiva/sclera: Conjunctivae normal.  Cardiovascular:     Rate and Rhythm: Normal rate and regular rhythm.  Pulmonary:     Effort: Pulmonary effort is normal.     Breath sounds: Normal breath sounds.  Skin:    General: Skin is warm and dry.  Neurological:     General: No focal deficit present.     Mental Status: She is alert and oriented to person, place, and time.  Psychiatric:        Mood and Affect: Mood normal.        Behavior: Behavior normal.        Thought Content: Thought content normal.        Judgment: Judgment normal.     Flowsheet Row Office Visit from 12/17/2023 in [facility] [place]  PHQ-9 Total Score 2     Impression and Plan:  Fatigue, unspecified type -     Albuterol  Sulfate HFA; Inhale 2 puffs into the lungs every 4 (four) hours as needed for wheezing or shortness of breath.  Dispense: 1  each; Refill: 2 -     Comprehensive metabolic panel with GFR; Future -     CBC with Differential/Platelet; Future -     Vitamin B12; Future -     TSH; Future -     IBC + Ferritin; Future  Vitamin D  deficiency -     VITAMIN D  25 Hydroxy (Vit-D Deficiency, Fractures); Future   - I think her fatigue is due to her social situation with work, school, young child.  However will rule out vitamin deficiencies, iron  deficiency, anemia hypothyroidism with lab work today.  No signs of depression.  Time spent:31 minutes reviewing chart, interviewing and examining patient and formulating plan of care.     Tully Theophilus Andrews, MD [facility]

## 2023-12-21 ENCOUNTER — Encounter: Payer: Self-pay | Admitting: Nurse Practitioner

## 2023-12-21 NOTE — Telephone Encounter (Signed)
 Spoke with patient. Explained Vit D Rx sent on 05/20/23. Patient never picked up Rx. Patient will contact [facility], is aware to call if Rx needed. Patient will call to schedule f/u labs once she confirms Rx is available.   Questions answered. Patient appreciative of call.   Routing to provider for final review. Patient is agreeable to disposition. Will close encounter.

## 2024-01-12 ENCOUNTER — Other Ambulatory Visit: Payer: Self-pay

## 2024-01-12 MED ORDER — MEDROXYPROGESTERONE ACETATE 150 MG/ML IM SUSY
150.0000 mg | PREFILLED_SYRINGE | INTRAMUSCULAR | 0 refills | Status: DC
  Filled 2024-01-15: qty 1, 90d supply, fill #0

## 2024-01-12 NOTE — Progress Notes (Signed)
 One refill for Amanda Ortega sent, pt aware she needs an annual.

## 2024-01-15 ENCOUNTER — Other Ambulatory Visit (HOSPITAL_COMMUNITY): Payer: Self-pay

## 2024-01-18 ENCOUNTER — Telehealth: Admitting: Physician Assistant

## 2024-01-18 ENCOUNTER — Other Ambulatory Visit (HOSPITAL_COMMUNITY): Payer: Self-pay

## 2024-01-18 DIAGNOSIS — R197 Diarrhea, unspecified: Secondary | ICD-10-CM

## 2024-01-18 NOTE — Progress Notes (Signed)
 We are sorry that you are not feeling well.  Here is how we plan to help!  Based on what you have shared with me it looks like you have Acute Infectious Diarrhea.  Most cases of acute diarrhea are due to infections with virus and bacteria and are self-limited conditions lasting less than 14 days.  For your symptoms you may take Imodium 2 mg tablets that are over the counter at your local pharmacy. Take two tablet now and then one after each loose stool up to 6 a day.   Antibiotics are not needed for most people with diarrhea.  HOME CARE We recommend changing your diet to help with your symptoms for the next few days. Drink plenty of fluids that contain water salt and sugar. Sports drinks such as Gatorade may help.  You may try broths, soups, bananas, applesauce, soft breads, mashed potatoes or crackers.  You are considered infectious for as long as the diarrhea continues. Hand washing or use of alcohol  based hand sanitizers is recommend. It is best to stay out of work or school until your symptoms stop.   GET HELP RIGHT AWAY If you have dark yellow colored urine or do not pass urine frequently you should drink more fluids.   If your symptoms worsen  If you feel like you are going to pass out (faint) You have a new problem  MAKE SURE YOU  Understand these instructions. Will watch your condition. Will get help right away if you are not doing well or get worse.  Your e-visit answers were reviewed by a board certified advanced clinical practitioner to complete your personal care plan.  Depending on the condition, your plan could have included both over the counter or prescription medications.  If there is a problem please reply  once you have received a response from your provider.  Your safety is important to us .  If you have drug allergies check your prescription carefully.    You can use MyChart to ask questions about today's visit, request a non-urgent call back, or ask for a work  or school excuse for 24 hours related to this e-Visit. If it has been greater than 24 hours you will need to follow up with your provider, or enter a new e-Visit to address those concerns.   You will get an e-mail in the next two days asking about your experience.  I hope that your e-visit has been valuable and will speed your recovery. Thank you for using e-visits.   I have spent 5 minutes in review of e-visit questionnaire, review and updating patient chart, medical decision making and response to patient.   Delon CHRISTELLA Dickinson, PA-C

## 2024-01-19 ENCOUNTER — Ambulatory Visit (INDEPENDENT_AMBULATORY_CARE_PROVIDER_SITE_OTHER)

## 2024-01-19 DIAGNOSIS — Z3042 Encounter for surveillance of injectable contraceptive: Secondary | ICD-10-CM

## 2024-01-19 MED ORDER — MEDROXYPROGESTERONE ACETATE 150 MG/ML IM SUSP
150.0000 mg | Freq: Once | INTRAMUSCULAR | Status: AC
  Administered 2024-01-19: 150 mg via INTRAMUSCULAR

## 2024-01-19 NOTE — Progress Notes (Signed)
 Date last pap: Pt due for AEX. Is aware and will call back to schedule. Last Depo-Provera : 11/05/23. Pt requested last time this appt be two days early due to a trip she had planned.  Side Effects if any: NA. Serum HCG indicated? NA. Depo-Provera  150 mg IM given by: Duwaine Galla, RN in [facility]. Next appointment due Jan 13-27 2026.

## 2024-04-08 ENCOUNTER — Telehealth: Admitting: Family

## 2024-04-08 DIAGNOSIS — L309 Dermatitis, unspecified: Secondary | ICD-10-CM

## 2024-04-08 MED ORDER — TRIAMCINOLONE ACETONIDE 0.1 % EX CREA: 1.0000 | TOPICAL_CREAM | Freq: Two times a day (BID) | CUTANEOUS | 0 refills | Status: AC

## 2024-04-08 NOTE — Progress Notes (Signed)
 " E-Visit for Eczema  We are sorry that you are not feeling well. Here is how we plan to help! Based on what you shared with me it looks like you have eczema (atopic dermatitis).  Although the cause of eczema is not completely understood, genetics appear to play a strong role, and people with a family history of eczema are at increased risk of developing the condition. In most people with eczema, there is a genetic abnormality in the outermost layer of the skin, called the epidermis   Most people with eczema develop their first symptoms as children, before the age of 61. Intense itching of the skin, patches of redness, small bumps, and skin flaking are common. Scratching can further inflame the skin and worsen the itching. The itchiness may be more noticeable at nighttime.  Eczema commonly affects the back of the neck, the elbow creases, and the backs of the knees. Other affected areas may include the face, wrists, and forearms. The skin may become thickened and darkened, or even scarred, from repeated scratching. Eliminating factors that aggravate your eczema symptoms can help to control the symptoms. Possible triggers may include: ? Cold or dry environments ? Sweating ? Emotional stress or anxiety ? Rapid temperature changes ? Exposure to certain chemicals or cleaning solutions, including soaps and detergents, perfumes and cosmetics, wool or synthetic fibers, dust, sand, and cigarette smoke  Keeping Your Skin Hydrated: Emollients -- Emollients are creams and ointments that moisturize the skin and prevent it from drying out. The best emollients for people with eczema are thick creams (such as Eucerin, Cetaphil, and Lubriderm) or ointments (such as petroleum jelly, Aquaphor, and Vaseline), which contain little to no water. Emollients are most effective when applied immediately after bathing. Emollients can be applied twice a day or more often if needed. Lotions contain more water than creams and  ointments, and are less effective for moisturizing the skin.  Bathing -- It is not clear if showers or baths are better for keeping the skin hydrated. Lukewarm baths or showers can hydrate and cool the skin, temporarily relieving itching from eczema. It is recommended to use an unscented, gentle cleanser (such as Cetaphil) in small amounts when bathing. Apply an emollient immediately after bathing or showering to prevent your skin from drying out as a result of water evaporation. Emollient bath additives (products you add to the bath water) have not been found to help relieve symptoms.  Hot or long (more than 10-15 minutes) baths and showers should be avoided since they can dry out the skin.  Based on your current eczema symptoms I have prescribed: Triamcinalone ointment (or cream). Apply to the affected areas in a thin layer twice daily. Do not use on face, armpits, or privates.   I recommend dilute bleach baths for people with eczema. These baths help to decrease the number of bacteria on the skin that can cause infections or worsen symptoms. To prepare a bleach bath, one-fourth to one-half cup of bleach is placed in a full bathtub (about 40 gallons) of water. Bleach baths are usually taken for 5 to 10 minutes twice per week and should be followed by application of an emollient (listed above).  I recommend you take an antihistamine like Claritin or Zyrtec once daily to help control the symptoms (including itching) but if they last over 24 hours it is best that you see an office based provider for follow up.  HOME CARE: Take lukewarm showers or baths. Apply creams and ointments to  prevent the skin from drying (Eucerin, Cetaphil, Lubriderm, Vaseline) - these products contain less water than other lotions and are more effective for moisturizing the skin. Limit exposure to cold or dry environments, sweating, emotional stress and anxiety, rapid temperature changes and exposure to chemicals and cleaning  products, soaps and detergents, perfumes, cosmetics, wool and synthetic fibers, dust, sand and cigarette- factors which can aggravate eczema symptoms.  Use a hydrocortisone cream once or twice a day.  GET HELP RIGHT AWAY IF: Symptoms that don't go away after treatment. Severe itching that persists. You develop a fever. Your skin begins to drain. You develop a sore throat. You become short of breath.  MAKE SURE YOU   Have read through these instructions carefully. Continue to monitor your condition and seek care in-person for any non resolving, new or worsening symptoms despite treatments given in this e-visit.    Thank you for choosing an e-visit.  Your e-visit answers were reviewed by a board certified advanced clinical practitioner to complete your personal care plan. Depending upon the condition, your plan could have included both over the counter or prescription medications.  Please review your pharmacy choice. Make sure the pharmacy is open so you can pick up prescription now. If there is a problem, you may contact your provider through [employer] and have the prescription routed to another pharmacy.  Your safety is important to us . If you have drug allergies check your prescription carefully.   For the next 24 hours you can use MyChart to ask questions about today's visit, request a non-urgent call back, or ask for a work or school excuse. You will get an email in the next two days asking about your experience. I hope that your e-visit has been valuable and will speed your recovery.   I have spent 5 minutes in review of e-visit questionnaire, review and updating patient chart, medical decision making and response to patient.   Bari Learn, FNP     "

## 2024-04-12 ENCOUNTER — Telehealth: Payer: Self-pay | Admitting: *Deleted

## 2024-04-12 ENCOUNTER — Other Ambulatory Visit: Payer: Self-pay

## 2024-04-12 MED ORDER — MEDROXYPROGESTERONE ACETATE 150 MG/ML IM SUSY: 150.0000 mg | PREFILLED_SYRINGE | INTRAMUSCULAR | 0 refills | Status: AC

## 2024-04-12 NOTE — Telephone Encounter (Addendum)
 Amanda Ortega routed conversation to [employer] TriageYesterday (10:15 AM)   Amanda Ortega  P [facility] Admin (supporting Amanda DELENA Shutter, NP)2 days ago   DR Appointment Request From: Amanda Ortega   With Provider: Annabella Shutter, NP [facility] [place]]   Preferred Date Range: 04/15/2024 - 04/18/2024   Preferred Times: Any   Reason for visit: Annual Physical   Health Maintenance Topic:    Comments: I need a refill on my birth control but need a Pap smear first . This is what the clinic at [facility] across the street told me last time I was given my birth control. Can someone please call me to discuss this matter because I have questions. It is also time for my annual visit as well. I would like to schedule one.

## 2024-04-12 NOTE — Telephone Encounter (Signed)
 Med refill request:Depo Provera  Last AEX: 05/18/23 -TW Next AEX:Not scheduled Last MMG (if hormonal med) N/A  Spoke with patient. Patient states she has already scheduled depo with [facility] on 04/18/24. Patient states she would like to schedule next AEX with [facility], request a 4pm appt. AEX scheduled for 06/13/24 at 1600. Advised patient she would need to contact [facility] to cancel AEX. Patient states she plans to proceed with Depo at [facility] and return to [facility] after. Offered [place] for depo at [facility], patient declined. States she will return after next AEX.   Routing to provider for final review. Patient is agreeable to disposition. Will close encounter.

## 2024-04-15 ENCOUNTER — Ambulatory Visit: Payer: Self-pay

## 2024-04-18 ENCOUNTER — Ambulatory Visit

## 2024-04-19 ENCOUNTER — Ambulatory Visit: Admitting: Internal Medicine

## 2024-04-19 ENCOUNTER — Ambulatory Visit

## 2024-04-19 ENCOUNTER — Encounter: Payer: Self-pay | Admitting: Internal Medicine

## 2024-04-19 ENCOUNTER — Telehealth: Payer: Self-pay

## 2024-04-19 VITALS — BP 104/62 | HR 90 | Temp 98.5°F | Wt 171.4 lb

## 2024-04-19 VITALS — BP 112/73 | HR 90 | Ht 65.0 in | Wt 170.8 lb

## 2024-04-19 DIAGNOSIS — Z3042 Encounter for surveillance of injectable contraceptive: Secondary | ICD-10-CM

## 2024-04-19 DIAGNOSIS — N3001 Acute cystitis with hematuria: Secondary | ICD-10-CM | POA: Diagnosis not present

## 2024-04-19 DIAGNOSIS — R300 Dysuria: Secondary | ICD-10-CM | POA: Diagnosis not present

## 2024-04-19 LAB — POC URINALSYSI DIPSTICK (AUTOMATED)
Bilirubin, UA: NEGATIVE
Glucose, UA: NEGATIVE
Ketones, UA: NEGATIVE
Nitrite, UA: NEGATIVE
Protein, UA: POSITIVE — AB
Spec Grav, UA: 1.025
Urobilinogen, UA: 0.2 U/dL
pH, UA: 5.5

## 2024-04-19 MED ORDER — MEDROXYPROGESTERONE ACETATE 150 MG/ML IM SUSY
1.0000 mL | PREFILLED_SYRINGE | Freq: Once | INTRAMUSCULAR | Status: AC
  Administered 2024-04-19: 150 mg via INTRAMUSCULAR

## 2024-04-19 MED ORDER — NITROFURANTOIN MONOHYD MACRO 100 MG PO CAPS: 100.0000 mg | ORAL_CAPSULE | Freq: Two times a day (BID) | ORAL | 0 refills | Status: AC

## 2024-04-19 NOTE — Progress Notes (Signed)
 Date last pap: 08/29/2021 Last Depo-Provera : 01/19/24. Side Effects if any: none reported. Serum HCG indicated? no. Depo-Provera  150 mg IM given by: Orlene Deidra Bang, RN. Next appointment due 4/14-4/28 2026.  Due for AE. Pt transferring offices and scheduling future visits following todays injection.  Administrations This Visit     medroxyPROGESTERone  Acetate SUSY 150 mg     Admin Date 04/19/2024 Action Given Dose 150 mg Route Intramuscular Documented By Deidra Bang, Orlene, RN

## 2024-04-19 NOTE — Progress Notes (Signed)
 "    Established Patient Office Visit     CC/Reason for Visit: I have a UTI  HPI: Amanda Ortega is a 34 y.o. female who is coming in today for the above mentioned reasons.  For the past 12 hours has been having dysuria and a sense of incomplete bladder emptying.  She recognizes these of signs of a UTI and comes in today for evaluation.   Past Medical/Surgical History: Past Medical History:  Diagnosis Date   Anemia    Asthma    Eczema    Encounter for supervision of normal first pregnancy, third trimester 12/05/2021          Nursing Staff  Provider  Office Location  [facility]  Dating   06/01/2022, by Last Menstrual Period  [place] Day [facility]  [x]  Traditional  [ ]  Centering  [ ]  Mom-Baby Dy[facility]  Anatomy US    normal  Language   English        Flu Vaccine   11/2021  Genetic/Carrier Screen   NIPS:   Low risks  AFP:   neg  Horizon: negative x 4  TDaP Vaccine    06/10/2022  Hgb A1C or   GTT  Early   Third trimester : normal  COVID [facility]   Vaginal delivery 07/06/2022    Past Surgical History:  Procedure Laterality Date   DENTAL SURGERY      Social History:  reports that she has never smoked. She has never used smokeless tobacco. She reports that she does not currently use alcohol. She reports that she does not use drugs.  Allergies: Allergies[1]  Family History:  Family History  Problem Relation Age of Onset   Hypertension Mother    Colon cancer Mother    Hypertension Father    Hyperlipidemia Father    Asthma Maternal Grandmother     Current Medications[2]  Review of Systems:  Negative unless indicated in HPI.   Physical Exam: Vitals:   04/19/24 1257  BP: 104/62  Pulse: 90  Temp: 98.5 F (36.9 C)  TempSrc: Oral  SpO2: 98%  Weight: 171 lb 6.4 oz (77.7 kg)    Body mass index is 28.52 kg/m.     Impression and Plan:  Acute cystitis with hematuria -     Nitrofurantoin  Monohyd Macro; Take 1 capsule (100 mg total) by mouth 2 (two) times daily for 7 days.  Dispense: 14 capsule;  Refill: 0  Dysuria -     POCT Urinalysis Dipstick (Automated)   - In office urine dipstick positive for blood and leukocytes.  Treat with Macrobid .  Send for urine culture.  Time spent:22 minutes reviewing chart, interviewing and examining patient and formulating plan of care.     Tully Theophilus Andrews, MD [facility]     [1]  Allergies Allergen Reactions   Shellfish Allergy Swelling    Lip swelling    Naproxen Hives and Swelling    Tongue swelling   Doxycycline  Dermatitis    Exfoliative dermatitis of hands and feet.  [2]  Current Outpatient Medications:    albuterol  (VENTOLIN  HFA) 108 (90 Base) MCG/ACT inhaler, Inhale 2 puffs into the lungs every 4 (four) hours as needed for wheezing or shortness of breath., Disp: 1 each, Rfl: 2   ferrous sulfate  325 (65 FE) MG tablet, Take 325 mg by mouth daily with breakfast., Disp: , Rfl:    medroxyPROGESTERone  Acetate 150 MG/ML SUSY, Inject 1 mL (150 mg total) into the muscle every 3 (three) months., Disp:  1 mL, Rfl: 0   triamcinolone  cream (KENALOG ) 0.1 %, Apply 1 Application topically 2 (two) times daily., Disp: 60 g, Rfl: 0   nitrofurantoin , macrocrystal-monohydrate, (MACROBID ) 100 MG capsule, Take 1 capsule (100 mg total) by mouth 2 (two) times daily for 7 days., Disp: 14 capsule, Rfl: 0   Prenatal MV-Min-Fe Fum-FA-DHA ([employer] PRENATAL MULTI+DHA) 27-0.8-250 MG CAPS, Take 1 capsule by mouth daily. (Patient not taking: Reported on 04/19/2024), Disp: , Rfl:   Current Facility-Administered Medications:    medroxyPROGESTERone  (DEPO-PROVERA ) injection 150 mg, 150 mg, Intramuscular, Q90 days, Abigail Rollo DASEN, MD, 150 mg at 11/05/23 1025  "

## 2024-04-19 NOTE — Addendum Note (Signed)
 Addended by: KATHRYNE MILLMAN B on: 04/19/2024 03:12 PM   Modules accepted: Orders

## 2024-04-20 LAB — URINE CULTURE
MICRO NUMBER:: 17516586
SPECIMEN QUALITY:: ADEQUATE

## 2024-04-20 LAB — URINALYSIS, ROUTINE W REFLEX MICROSCOPIC
Bilirubin Urine: NEGATIVE
Ketones, ur: NEGATIVE
Nitrite: NEGATIVE
Specific Gravity, Urine: >=1.03 — AB (ref 1.000–1.030)
Total Protein, Urine: 30 — AB
Urine Glucose: NEGATIVE
Urobilinogen, UA: 0.2 (ref 0.0–1.0)
pH: 6 (ref 5.0–8.0)

## 2024-05-19 ENCOUNTER — Ambulatory Visit: Payer: Self-pay | Admitting: Obstetrics

## 2024-06-13 ENCOUNTER — Ambulatory Visit: Admitting: Nurse Practitioner
